# Patient Record
Sex: Female | Born: 1975 | Race: White | Hispanic: No | Smoking: Current every day smoker
Health system: Southern US, Community
[De-identification: ages and names within clinical notes are randomized; demographics above are authoritative.]

## PROBLEM LIST (undated history)

## (undated) DIAGNOSIS — F319 Bipolar disorder, unspecified: Secondary | ICD-10-CM

## (undated) DIAGNOSIS — F4310 Post-traumatic stress disorder, unspecified: Secondary | ICD-10-CM

## (undated) DIAGNOSIS — B1920 Unspecified viral hepatitis C without hepatic coma: Secondary | ICD-10-CM

## (undated) DIAGNOSIS — O24419 Gestational diabetes mellitus in pregnancy, unspecified control: Secondary | ICD-10-CM

## (undated) HISTORY — PX: APPENDECTOMY: SHX54

---
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
## 2004-11-11 ENCOUNTER — Emergency Department: Payer: Self-pay | Admitting: Emergency Medicine

## 2005-07-22 ENCOUNTER — Emergency Department: Payer: Self-pay | Admitting: General Practice

## 2005-07-24 ENCOUNTER — Emergency Department: Payer: Self-pay

## 2005-10-01 ENCOUNTER — Emergency Department: Payer: Self-pay | Admitting: Emergency Medicine

## 2006-03-03 ENCOUNTER — Emergency Department: Payer: Self-pay | Admitting: Emergency Medicine

## 2006-09-04 ENCOUNTER — Emergency Department: Payer: Self-pay | Admitting: Emergency Medicine

## 2006-10-02 ENCOUNTER — Emergency Department: Payer: Self-pay | Admitting: Emergency Medicine

## 2007-04-30 DIAGNOSIS — Z915 Personal history of self-harm: Secondary | ICD-10-CM | POA: Insufficient documentation

## 2007-04-30 DIAGNOSIS — F1910 Other psychoactive substance abuse, uncomplicated: Secondary | ICD-10-CM | POA: Insufficient documentation

## 2007-06-08 ENCOUNTER — Ambulatory Visit: Payer: Self-pay

## 2008-04-03 ENCOUNTER — Emergency Department: Payer: Self-pay | Admitting: Emergency Medicine

## 2008-05-15 ENCOUNTER — Emergency Department: Payer: Self-pay | Admitting: Emergency Medicine

## 2008-08-01 ENCOUNTER — Emergency Department: Payer: Self-pay | Admitting: Emergency Medicine

## 2010-11-20 ENCOUNTER — Emergency Department: Payer: Self-pay | Admitting: Emergency Medicine

## 2010-12-07 ENCOUNTER — Emergency Department: Payer: Self-pay | Admitting: Internal Medicine

## 2011-04-19 ENCOUNTER — Emergency Department: Payer: Self-pay | Admitting: Emergency Medicine

## 2011-05-05 ENCOUNTER — Emergency Department: Payer: Self-pay | Admitting: *Deleted

## 2011-07-08 ENCOUNTER — Emergency Department: Payer: Self-pay | Admitting: Emergency Medicine

## 2013-01-15 ENCOUNTER — Observation Stay: Payer: Self-pay | Admitting: Surgery

## 2013-01-15 LAB — CBC WITH DIFFERENTIAL/PLATELET
BASOS PCT: 0.5 %
Basophil #: 0.1 10*3/uL (ref 0.0–0.1)
Eosinophil #: 0.1 10*3/uL (ref 0.0–0.7)
Eosinophil %: 1.1 %
HCT: 44.1 % (ref 35.0–47.0)
HGB: 15 g/dL (ref 12.0–16.0)
LYMPHS ABS: 1.8 10*3/uL (ref 1.0–3.6)
LYMPHS PCT: 12.9 %
MCH: 30.4 pg (ref 26.0–34.0)
MCHC: 34 g/dL (ref 32.0–36.0)
MCV: 90 fL (ref 80–100)
Monocyte #: 1.2 x10 3/mm — ABNORMAL HIGH (ref 0.2–0.9)
Monocyte %: 8.7 %
NEUTROS PCT: 76.8 %
Neutrophil #: 10.5 10*3/uL — ABNORMAL HIGH (ref 1.4–6.5)
PLATELETS: 271 10*3/uL (ref 150–440)
RBC: 4.92 10*6/uL (ref 3.80–5.20)
RDW: 13.1 % (ref 11.5–14.5)
WBC: 13.7 10*3/uL — ABNORMAL HIGH (ref 3.6–11.0)

## 2013-01-15 LAB — PREGNANCY, URINE: Pregnancy Test, Urine: NEGATIVE m[IU]/mL

## 2013-01-15 LAB — COMPREHENSIVE METABOLIC PANEL
ALBUMIN: 4.1 g/dL (ref 3.4–5.0)
ALT: 27 U/L (ref 12–78)
Alkaline Phosphatase: 81 U/L
Anion Gap: 20 — ABNORMAL HIGH (ref 7–16)
BUN: 6 mg/dL — AB (ref 7–18)
Bilirubin,Total: 0.7 mg/dL (ref 0.2–1.0)
CHLORIDE: 104 mmol/L (ref 98–107)
CO2: 21 mmol/L (ref 21–32)
Calcium, Total: 8.9 mg/dL (ref 8.5–10.1)
Creatinine: 0.64 mg/dL (ref 0.60–1.30)
EGFR (African American): >60
EGFR (Non-African Amer.): >60
GLUCOSE: 129 mg/dL — AB (ref 65–99)
Osmolality: 288 (ref 275–301)
POTASSIUM: 3.5 mmol/L (ref 3.5–5.1)
SGOT(AST): 23 U/L (ref 15–37)
SODIUM: 145 mmol/L (ref 136–145)
Total Protein: 8 g/dL (ref 6.4–8.2)

## 2013-01-15 LAB — URINALYSIS, COMPLETE
BILIRUBIN, UR: NEGATIVE
Blood: NEGATIVE
GLUCOSE, UR: NEGATIVE mg/dL (ref 0–75)
Leukocyte Esterase: NEGATIVE
Nitrite: NEGATIVE
Ph: 5 (ref 4.5–8.0)
Protein: 30
Specific Gravity: 1.025 (ref 1.003–1.030)
Squamous Epithelial: 1
WBC UR: 1 /HPF (ref 0–5)

## 2013-01-15 LAB — LIPASE, BLOOD: Lipase: 108 U/L (ref 73–393)

## 2013-01-20 LAB — PATHOLOGY REPORT

## 2013-02-28 ENCOUNTER — Emergency Department: Payer: Self-pay | Admitting: Emergency Medicine

## 2013-06-03 ENCOUNTER — Emergency Department: Payer: Self-pay | Admitting: Emergency Medicine

## 2014-04-29 NOTE — H&P (Signed)
PATIENT NAME:  Yolanda Reed, [name] MR#:  [id number] DATE OF BIRTH:  May 30, 1975  DATE OF ADMISSION:  01/15/2013  CHIEF COMPLAINT: Right lower quadrant pain.   HISTORY OF PRESENT ILLNESS: This is a patient with right lower quadrant pain and tenderness with signs of peritoneal irritation and a work-up showing probable appendicitis. The patient describes 18 hours of abdominal pain worsening over the last few hours. She has had nausea and vomiting. No diarrhea and has had normal bowel movement. She denies fevers or chills. Has never had an episode like this before.   PAST MEDICAL HISTORY: Depression, but she does not take any medications for it.   PAST SURGICAL HISTORY: C-section.   ALLERGIES: None.   MEDICATIONS: Vitamins.   FAMILY HISTORY: Noncontributory. Her mother just died of CHF.   SOCIAL HISTORY: The patient smokes tobacco. Does not drink alcohol and is currently unemployed.   REVIEW OF SYSTEMS: A 10 system review is performed and negative with the exception of that mentioned in the history of present illness.   PHYSICAL EXAMINATION: GENERAL: Healthy, comfortable-appearing female patient. She prefers this sit up rather than lie down.  VITAL SIGNS: Temperature of 98.2, pulse 84, respirations 18, blood pressure 138/75, 100% room air sat. Pain scale of 5.  HEENT: Shows no scleral icterus.  NECK: No palpable neck nodes.  CHEST: Clear to auscultation.  CARDIAC: Regular rate and rhythm.  ABDOMEN: Soft. There is tenderness in the right lower quadrant with some focal guarding. A positive Rovsing sign, positive percussion tenderness at McBurney's point.  EXTREMITIES: Without edema.  NEUROLOGIC: Grossly intact.  INTEGUMENT: Shows multiple tattoos.   LABORATORY DATA: White blood cell count is elevated at 13.7, hemoglobin and hematocrit of 15 and 44 and platelet count 271. Electrolytes are within normal limits.   The CT scan demonstrates acute appendicitis with stranding in the right hemipelvis.    ASSESSMENT AND PLAN: This is a patient with history, physical and CT findings suggestive of acute appendicitis. I have recommended laparoscopy.  The options of observation have been reviewed. The rationale for offering this surgery has been discussed and the risks of bleeding, infection, recurrence, negative laparoscopy and an open procedure were all reviewed. She understood and agreed to proceed.    ____________________________ Adah Salvageichard E. Excell Seltzerooper, MD rec:dp D: 01/15/2013 09:52:11 ET T: 01/15/2013 10:20:01 ET JOB#: [id number]  cc: Adah Salvageichard E. Excell Seltzerooper, MD, <Dictator> Lattie HawICHARD E COOPER MD ELECTRONICALLY SIGNED 01/19/2013 11:46

## 2014-04-29 NOTE — Op Note (Signed)
PATIENT NAME:  Yolanda Reed, Yolanda Reed MR#:  [id number] DATE OF BIRTH:  07/20/1975  DATE OF PROCEDURE:  Alessandra Grout01/10/2013  PREOPERATIVE DIAGNOSIS: Acute appendicitis.   POSTOPERATIVE DIAGNOSIS: Acute appendicitis.   PROCEDURE: Laparoscopic appendectomy.   SURGEON: Richard E. Excell Seltzerooper, M.D.   ANESTHESIA: General with endotracheal tube.   INDICATIONS: This is a patient with progressive right lower quadrant pain and tenderness with signs of peritoneal irritation. Preoperatively, we discussed rationale for surgery, the options of observation, risk of bleeding, infection, recurrence of symptoms, failure to resolve her symptoms, open procedure and negative laparoscopy. This was all reviewed for her. She understood and agreed to proceed.   FINDINGS: Acute appendicitis, nonruptured. Extensive adhesions in the right lower quadrant.   DESCRIPTION OF PROCEDURE: The patient was induced to general anesthesia, given IV antibiotics. VTE prophylaxis was in place. She was prepped and draped in a sterile fashion. Marcaine was infiltrated in skin and subcutaneous tissues around the periumbilical area. An incision was made. Veress needle was placed. Pneumoperitoneum was obtained and a 5 mm trocar port was placed. The abdominal cavity was explored and under direct vision, a 12 mm left lateral port was placed and a 5 mm suprapubic port was placed. The appendix was identified in the right lower quadrant. There were extensive adhesions of right colon to the anterior abdominal wall, but this did not involve the area of the terminal ileum and cecum where the appendix was identified easily. The base of the appendix was divided with an  Endo GIA standard load and then multiple firings of the vascular load were performed over the mesoappendix and the specimen was passed out through the lateral port site with the aid of an Endo Catch bag. The port site was(Dictation Anomaly) <<replaced>> . The area was checked for hemostasis. There were 2 arterial  bleeders on the staple line, which were handled with clips. No further bleeding was noted. The area was irrigated with copious amounts of normal saline. Hemostasis was adequate, the left lateral port site was closed under direct vision utilizing an Endo Close technique with 0 Vicryl simple sutures. Again, hemostasis found to be adequate, therefore, pneumoperitoneum was released. All ports were removed. 4-0 subcuticular Monocryl was used on all skin edges. Steri-Strips, Mastisol and sterile dressings were placed. The patient tolerated the procedure well. There were no complications. She was taken to the recovery room in stable condition to be admitted for continued care.    ____________________________ Adah Salvageichard E. Excell Seltzerooper, MD rec:dp D: 01/15/2013 12:40:00 ET T: 01/15/2013 16:38:32 ET JOB#: [id number]  cc: Adah Salvageichard E. Excell Seltzerooper, MD, <Dictator> Lattie HawICHARD E COOPER MD ELECTRONICALLY SIGNED 01/19/2013 11:47

## 2014-04-29 NOTE — H&P (Signed)
Subjective/Chief Complaint RLQ pain   History of Present Illness 18 hrs pain, n/v nml bm no f/c   Past History PMH none PSH C section   Past Medical Health Smoking   Past Med/Surgical Hx:  Depression:   c-section:   ALLERGIES:  No Known Allergies:   Family and Social History:  Family History Non-Contributory   Social History positive  tobacco, negative ETOH, unemployed   + Tobacco Current (within 1 year)   Review of Systems:  Fever/Chills No   Cough No   Abdominal Pain Yes   Diarrhea No   Constipation No   Nausea/Vomiting Yes   SOB/DOE No   Chest Pain No   Dysuria No   Tolerating Diet No  Nauseated  Vomiting   Medications/Allergies Reviewed Medications/Allergies reviewed   Physical Exam:  GEN no acute distress   HEENT pink conjunctivae   NECK supple   RESP normal resp effort  clear BS   CARD regular rate   ABD positive tenderness  soft  max rlq with pos rovsing's   LYMPH negative neck   SKIN normal to palpation   PSYCH alert, A+O to time, place, person   Lab Results: Hepatic:  10-Jan-15 00:54   Bilirubin, Total 0.7  Alkaline Phosphatase 81 (45-117 NOTE: New Reference Range 11/26/12)  SGPT (ALT) 27  SGOT (AST) 23  Total Protein, Serum 8.0  Albumin, Serum 4.1  Routine Chem:  10-Jan-15 00:54   Glucose, Serum  129  BUN  6  Creatinine (comp) 0.64  Sodium, Serum 145  Potassium, Serum 3.5  Chloride, Serum 104  CO2, Serum 21  Calcium (Total), Serum 8.9  Osmolality (calc) 288  eGFR (African American) >60  eGFR (Non-African American) >60 (eGFR values <57m/min/1.73 m2 may be an indication of chronic kidney disease (CKD). Calculated eGFR is useful in patients with stable renal function. The eGFR calculation will not be reliable in acutely ill patients when serum creatinine is changing rapidly. It is not useful in  patients on dialysis. The eGFR calculation may not be applicable to patients at the low and high extremes of body  sizes, pregnant women, and vegetarians.)  Anion Gap  20  Lipase 108 (Result(s) reported on 15 Jan 2013 at 01:25AM.)  Routine UA:  10-Jan-15 00:54   Color (UA) Yellow  Clarity (UA) Clear  Glucose (UA) Negative  Bilirubin (UA) Negative  Ketones (UA) 1+  Specific Gravity (UA) 1.025  Blood (UA) Negative  pH (UA) 5.0  Protein (UA) 30 mg/dL  Nitrite (UA) Negative  Leukocyte Esterase (UA) Negative (Result(s) reported on 15 Jan 2013 at 01:18AM.)  RBC (UA) 3 /HPF  WBC (UA) 1 /HPF  Bacteria (UA) TRACE  Epithelial Cells (UA) 1 /HPF  Mucous (UA) PRESENT (Result(s) reported on 15 Jan 2013 at 01:18AM.)  Routine Sero:  10-Jan-15 00:54   Pregnancy Test, Urine NEGATIVE (The results of the qualitative urine HCG (Pregnancy Test) should be evaluated in light of other clinical information.  There are limitations to the test which, in certain clinical situations, may result in a false positive or negative result. Thehigh dose hook effect can occur in urine samples with extremely high HCG concentrations.  This effect can produce a negative result in certain situations. It is suggested that results of the qualitative HCG be confirmed by an alternate methodology, such as the quantitative serum beta HCG test.)  Routine Hem:  10-Jan-15 00:54   WBC (CBC)  13.7  RBC (CBC) 4.92  Hemoglobin (CBC) 15.0  Hematocrit (CBC)  44.1  Platelet Count (CBC) 271  MCV 90  MCH 30.4  MCHC 34.0  RDW 13.1  Neutrophil % 76.8  Lymphocyte % 12.9  Monocyte % 8.7  Eosinophil % 1.1  Basophil % 0.5  Neutrophil #  10.5  Lymphocyte # 1.8  Monocyte #  1.2  Eosinophil # 0.1  Basophil # 0.1 (Result(s) reported on 15 Jan 2013 at 01:12AM.)   Radiology Results: CT:    10-Jan-15 08:10, CT Abdomen and Pelvis With Contrast  CT Abdomen and Pelvis With Contrast  REASON FOR EXAM:    (1) rlq abd pain with n/v; (2) see abd  COMMENTS:       PROCEDURE: CT  - CT ABDOMEN / PELVIS  W  - Jan 15 2013  8:10AM     CLINICAL DATA:   Sudden onset of abdominal pain radiating to the  right lower quadrant.    EXAM:  CT ABDOMENAND PELVIS WITH CONTRAST    TECHNIQUE:  Multidetector CT imaging of the abdomen and pelvis was performed  using the standard protocol following bolus administration of  intravenous contrast.  CONTRAST:  125 cc Isovue 370    COMPARISON:  None.    FINDINGS:  Lung bases are clear.  No pleural or pericardial fluid.    The liver has a normal appearance without focal lesions or biliary  ductal dilatation. No calcified gallstones. The spleen is normal.  The pancreas is normal. The adrenal glands are normal. The kidneys  are normal. No evidence of cyst, mass, stone or hydronephrosis.    The appendix is dilated and fluid-filled and there is mild  periappendiceal stranding consistent with early appendicitis.  The uterus and adnexal regions are normal. No other bowel pathology  is seen.     IMPRESSION:  Early appendicitis. The appendix is present in the right pelvis. No  evidence of rupture or abscess at this time.      Electronically Signed    By: Nelson Chimes M.D.    On: 01/15/2013 08:30         Verified By: Jules Schick, M.D.,    Assessment/Admission Diagnosis ac appendicitis lap appy risks and options agrees with plan   Electronic Signatures: Florene Glen (MD)  (Signed 10-Jan-15 09:49)  Authored: CHIEF COMPLAINT and HISTORY, PAST MEDICAL/SURGIAL HISTORY, ALLERGIES, FAMILY AND SOCIAL HISTORY, REVIEW OF SYSTEMS, PHYSICAL EXAM, LABS, Radiology, ASSESSMENT AND PLAN   Last Updated: 10-Jan-15 09:49 by Florene Glen (MD)

## 2015-09-20 DIAGNOSIS — F4310 Post-traumatic stress disorder, unspecified: Secondary | ICD-10-CM

## 2015-09-20 DIAGNOSIS — F319 Bipolar disorder, unspecified: Secondary | ICD-10-CM

## 2015-09-20 HISTORY — DX: Bipolar disorder, unspecified: F31.9

## 2015-09-20 HISTORY — DX: Post-traumatic stress disorder, unspecified: F43.10

## 2015-09-20 LAB — HM PAP SMEAR: HM Pap smear: NEGATIVE

## 2015-09-20 LAB — HM HIV SCREENING LAB: HM HIV Screening: NEGATIVE

## 2015-10-31 ENCOUNTER — Encounter: Payer: Self-pay | Admitting: Emergency Medicine

## 2015-10-31 ENCOUNTER — Emergency Department
Admission: EM | Admit: 2015-10-31 | Discharge: 2015-11-01 | Disposition: A | Discharge status: Home / Self Care | Payer: Self-pay | Attending: Emergency Medicine | Admitting: Emergency Medicine

## 2015-10-31 DIAGNOSIS — R1032 Left lower quadrant pain: Secondary | ICD-10-CM | POA: Insufficient documentation

## 2015-10-31 DIAGNOSIS — F17210 Nicotine dependence, cigarettes, uncomplicated: Secondary | ICD-10-CM | POA: Insufficient documentation

## 2015-10-31 LAB — URINALYSIS COMPLETE WITH MICROSCOPIC (ARMC ONLY)
BILIRUBIN URINE: NEGATIVE
Bacteria, UA: NONE SEEN
GLUCOSE, UA: NEGATIVE mg/dL
KETONES UR: NEGATIVE mg/dL
Leukocytes, UA: NEGATIVE
NITRITE: NEGATIVE
PROTEIN: NEGATIVE mg/dL
SPECIFIC GRAVITY, URINE: 1.026 (ref 1.005–1.030)
pH: 5 (ref 5.0–8.0)

## 2015-10-31 LAB — COMPREHENSIVE METABOLIC PANEL
ALBUMIN: 3.9 g/dL (ref 3.5–5.0)
ALK PHOS: 60 U/L (ref 38–126)
ALT: 37 U/L (ref 14–54)
ANION GAP: 7 (ref 5–15)
AST: 31 U/L (ref 15–41)
BILIRUBIN TOTAL: 0.5 mg/dL (ref 0.3–1.2)
BUN: 15 mg/dL (ref 6–20)
CALCIUM: 8.8 mg/dL — AB (ref 8.9–10.3)
CO2: 24 mmol/L (ref 22–32)
CREATININE: 0.64 mg/dL (ref 0.44–1.00)
Chloride: 109 mmol/L (ref 101–111)
GFR calc non Af Amer: >60 mL/min (ref >60)
GLUCOSE: 142 mg/dL — AB (ref 65–99)
Potassium: 3.7 mmol/L (ref 3.5–5.1)
Sodium: 140 mmol/L (ref 135–145)
TOTAL PROTEIN: 7.1 g/dL (ref 6.5–8.1)

## 2015-10-31 LAB — CBC
HCT: 38.5 % (ref 35.0–47.0)
HEMOGLOBIN: 12.9 g/dL (ref 12.0–16.0)
MCH: 29.7 pg (ref 26.0–34.0)
MCHC: 33.5 g/dL (ref 32.0–36.0)
MCV: 88.7 fL (ref 80.0–100.0)
PLATELETS: 327 10*3/uL (ref 150–440)
RBC: 4.35 MIL/uL (ref 3.80–5.20)
RDW: 15.5 % — ABNORMAL HIGH (ref 11.5–14.5)
WBC: 10 10*3/uL (ref 3.6–11.0)

## 2015-10-31 LAB — POCT PREGNANCY, URINE: Preg Test, Ur: NEGATIVE

## 2015-10-31 LAB — LIPASE, BLOOD: Lipase: 27 U/L (ref 11–51)

## 2015-10-31 NOTE — ED Notes (Signed)
Pt reports pain to LLQ, but when pointing to painful area, points to L of umbilicus area.  Pt reports that the pain comes and goes and "travels" around her L side.  Pt also reports that she is due for her period in the next few days, but that this is "much worse than period cramps".

## 2015-10-31 NOTE — ED Provider Notes (Signed)
[facility] Emergency Department Provider Note   ____________________________________________   First MD Initiated Contact with Patient 10/31/15 2321     (approximate)  I have reviewed the triage vital signs and the nursing notes.   HISTORY  Chief Complaint Abdominal Pain    HPI Yolanda Reed is a 40 y.o. female who presents to the ED from home with a chief complaint of abdominal pain. Patient reports onset of left lower quadrant abdominal pain approximately 3-4 hours ago. Symptoms associated with nausea only. Ate chicken sandwich about 1 hour prior to onset of pain. Denies radiation to or from flank. Denies associatedfever, chills, chest pain, shortness of breath, vomiting, dysuria, hematuria, diarrhea. Last bowel movement earlier in the day which was normal for patient. Also denies vaginal discharge or bleeding. Reports she is due for her period in the next few days. Denies recent travel trauma. Currently patient is pain-free. No history of kidney stones, ovarian cysts or endometriosis.   Past medical history None  There are no active problems to display for this patient.   Past Surgical History:  Procedure Laterality Date  . APPENDECTOMY      Prior to Admission medications   Not on File    Allergies Review of patient's allergies indicates no known allergies.  No family history on file.  Social History Social History  Substance Use Topics  . Smoking status: Current Every Day Smoker    Packs/day: 1.00    Types: Cigarettes  . Smokeless tobacco: Never Used  . Alcohol use Yes    Review of Systems  Constitutional: No fever/chills. Eyes: No visual changes. ENT: No sore throat. Cardiovascular: Denies chest pain. Respiratory: Denies shortness of breath. Gastrointestinal: Positive for abdominal pain.  Positive for nausea, no vomiting.  No diarrhea.  No constipation. Genitourinary: Negative for dysuria. Musculoskeletal: Negative for back  pain. Skin: Negative for rash. Neurological: Negative for headaches, focal weakness or numbness.  10-point ROS otherwise negative.  ____________________________________________   PHYSICAL EXAM:  VITAL SIGNS: ED Triage Vitals [10/31/15 2154]  Enc Vitals Group     BP 126/84     Pulse Rate 89     Resp 18     Temp 99.2 F (37.3 C)     Temp Source Oral     SpO2 100 %     Weight 148 lb (67.1 kg)     Height 5\' 3"  (1.6 m)     Head Circumference      Peak Flow      Pain Score 6     Pain Loc      Pain Edu?      Excl. in GC?     Constitutional: Alert and oriented. Well appearing and in no acute distress. Eyes: Conjunctivae are normal. PERRL. EOMI. Head: Atraumatic. Nose: No congestion/rhinnorhea. Mouth/Throat: Mucous membranes are moist.  Oropharynx non-erythematous. Neck: No stridor.   Cardiovascular: Normal rate, regular rhythm. Grossly normal heart sounds.  Good peripheral circulation. Respiratory: Normal respiratory effort.  No retractions. Lungs CTAB. Gastrointestinal: Soft and nontender to both light and deep palpation. No distention. No abdominal bruits. No CVA tenderness. Musculoskeletal: No lower extremity tenderness nor edema.  No joint effusions. Neurologic:  Normal speech and language. No gross focal neurologic deficits are appreciated. No gait instability. Skin:  Skin is warm, dry and intact. No rash noted. Psychiatric: Mood and affect are normal. Speech and behavior are normal.  ____________________________________________   LABS (all labs ordered are listed, but only abnormal results are  displayed)  Labs Reviewed  COMPREHENSIVE METABOLIC PANEL - Abnormal; Notable for the following:       Result Value   Glucose, Bld 142 (*)    Calcium 8.8 (*)    All other components within normal limits  CBC - Abnormal; Notable for the following:    RDW 15.5 (*)    All other components within normal limits  URINALYSIS COMPLETEWITH MICROSCOPIC ([facility] ONLY) - Abnormal;  Notable for the following:    Color, Urine YELLOW (*)    APPearance CLEAR (*)    Hgb urine dipstick 1+ (*)    Squamous Epithelial / LPF 0-5 (*)    All other components within normal limits  LIPASE, BLOOD  POCT PREGNANCY, URINE   ____________________________________________  EKG  None ____________________________________________  RADIOLOGY  None ____________________________________________   PROCEDURES  Procedure(s) performed: None  Procedures  Critical Care performed: No  ____________________________________________   INITIAL IMPRESSION / ASSESSMENT AND PLAN / ED COURSE  Pertinent labs & imaging results that were available during my care of the patient were reviewed by me and considered in my medical decision making (see chart for details).  40 year old female who presents with left lower quadrant abdominal pain and nausea; both have subsided. Patient is eager for discharge as she starts a new job in the morning. Updated patient and spouse of laboratory and urinalysis results. Given that patient is pain-free and she has no abdominal tenderness to palpation, will hold imaging study and discharge per patient's request. Strict return precautions given. Both verbalize understanding and agree with plan of care.  Clinical Course     ____________________________________________   FINAL CLINICAL IMPRESSION(S) / ED DIAGNOSES  Final diagnoses:  Left lower quadrant pain      NEW MEDICATIONS STARTED DURING THIS VISIT:  New Prescriptions   No medications on file     Note:  This document was prepared using Dragon voice recognition software and may include unintentional dictation errors.    Irean HongJade J Sung, MD 11/01/15 [id number]

## 2015-10-31 NOTE — ED Triage Notes (Signed)
Pt ambulatory to triage with steady gait with c/o LLQ pain tonight accompanied by nausea. Pt denies diarrhea, vomiting, chest pain, or urinary symptoms. Pt alert and oriented x 4, no increased work in breathing.

## 2015-10-31 NOTE — Discharge Instructions (Signed)
Please return to the ER for recurrent or worsening symptoms, persistent vomiting, difficulty breathing or other concerns.

## 2015-10-31 NOTE — ED Notes (Signed)
Pt unable to provide urine specimen at this time. Pt provided with urine cup and informed to let staff know when able to provide specimen.

## 2015-11-01 NOTE — ED Notes (Signed)
Pt discharged to home.  Family member driving.  Discharge instructions reviewed.  Verbalized understanding.  No questions or concerns at this time.  Teach back verified.  Pt in NAD.  No items left in ED.   

## 2015-12-23 ENCOUNTER — Encounter: Payer: Self-pay | Admitting: Urgent Care

## 2015-12-23 ENCOUNTER — Emergency Department
Admission: EM | Admit: 2015-12-23 | Discharge: 2015-12-23 | Disposition: A | Discharge status: Home / Self Care | Payer: Self-pay | Attending: Emergency Medicine | Admitting: Emergency Medicine

## 2015-12-23 DIAGNOSIS — L03316 Cellulitis of umbilicus: Secondary | ICD-10-CM | POA: Insufficient documentation

## 2015-12-23 DIAGNOSIS — F17210 Nicotine dependence, cigarettes, uncomplicated: Secondary | ICD-10-CM | POA: Insufficient documentation

## 2015-12-23 MED ORDER — CLINDAMYCIN HCL 300 MG PO CAPS: 300.0000 mg | ORAL_CAPSULE | Freq: Three times a day (TID) | ORAL | 0 refills | Status: AC

## 2015-12-23 MED ORDER — MUPIROCIN 2 % EX OINT: 1.0000 "application " | TOPICAL_OINTMENT | Freq: Two times a day (BID) | CUTANEOUS | 0 refills | Status: DC

## 2015-12-23 NOTE — ED Provider Notes (Signed)
[facility] Emergency Department Provider Note  ____________________________________________   I have reviewed the triage vital signs and the nursing notes.   HISTORY  Chief Complaint Umbilical hernia    HPI Yolanda Reed is a 40 y.o. female with a history of skin infections in the past, who is incarcerated at one time, no known MRSA infections, has what appears to be infection on her belly button. She noticed this morning. There was some purulent discharge. It is red and inflamed. She denies any abdominal pain vomiting a change in her stooling. She denies any injury. She has no fever or other complaints.   History reviewed. No pertinent past medical history.  There are no active problems to display for this patient.   Past Surgical History:  Procedure Laterality Date  . APPENDECTOMY    . CESAREAN SECTION      Prior to Admission medications   Not on File    Allergies Morphine and related  No family history on file.  Social History Social History  Substance Use Topics  . Smoking status: Current Every Day Smoker    Packs/day: 1.00    Types: Cigarettes  . Smokeless tobacco: Never Used  . Alcohol use Yes    Review of Systems Constitutional: No fever/chills Eyes: No visual changes. ENT: No sore throat. No stiff neck no neck pain Cardiovascular: Denies chest pain. Respiratory: Denies shortness of breath. Gastrointestinal:   no vomiting.  No diarrhea.  No constipation. Genitourinary: Negative for dysuria. Musculoskeletal: Negative lower extremity swelling Skin: Negative for rash. Neurological: Negative for severe headaches, focal weakness or numbness. 10-point ROS otherwise negative.  ____________________________________________   PHYSICAL EXAM:  VITAL SIGNS: ED Triage Vitals  Enc Vitals Group     BP 12/23/15 0634 122/86     Pulse Rate 12/23/15 0634 86     Resp 12/23/15 0634 16     Temp 12/23/15 0634 98.6 F (37 C)     Temp  Source 12/23/15 0634 Oral     SpO2 12/23/15 0634 99 %     Weight 12/23/15 0635 145 lb (65.8 kg)     Height 12/23/15 0635 5' 3.5" (1.613 m)     Head Circumference --      Peak Flow --      Pain Score 12/23/15 0635 5     Pain Loc --      Pain Edu? --      Excl. in GC? --     Constitutional: Alert and oriented. Well appearing and in no acute distress. Cardiovascular: Normal rate, regular rhythm. Grossly normal heart sounds.  Good peripheral circulation. Respiratory: Normal respiratory effort.  No retractions. Lungs CTAB. Abdominal: Soft and nontender. No distention. No guarding no rebound  Skin:  Skin is warm, dry and intact.There is some inflammation and excoriation to the umbilicus, on the inner surface. There is no active drainage at this time. It is mildly tender. No surrounding cellulitis. Psychiatric: Mood and affect are normal. Speech and behavior are normal.  ____________________________________________   LABS (all labs ordered are listed, but only abnormal results are displayed)  Labs Reviewed - No data to display ____________________________________________  EKG  I personally interpreted any EKGs ordered by me or triage  ____________________________________________  RADIOLOGY  I reviewed any imaging ordered by me or triage that were performed during my shift and, if possible, patient and/or family made aware of any abnormal findings. ____________________________________________   PROCEDURES  Procedure(s) performed: None  Procedures  Critical Care performed:  None  ____________________________________________   INITIAL IMPRESSION / ASSESSMENT AND PLAN / ED COURSE  Pertinent labs & imaging results that were available during my care of the patient were reviewed by me and considered in my medical decision making (see chart for details).  Patient with what appears to be an early cellulitic process in her belly button. We will treat with clindamycin. At this  time is nothing to culture. Return precautions and follow-up given and understood. Patient has no history of drainage from the umbilicus, nothing to suggest persistent urachal cyst or other deeper pathology. However, she understands she must come back if she gets worse. I will try clindamycin for MRSA coverage.  Clinical Course    ____________________________________________   FINAL CLINICAL IMPRESSION(S) / ED DIAGNOSES  Final diagnoses:  None      This chart was dictated using voice recognition software.  Despite best efforts to proofread,  errors can occur which can change meaning.      Jeanmarie PlantJames A Dariyon Urquilla, MD 12/23/15 [phone]

## 2015-12-23 NOTE — ED Triage Notes (Signed)
Patient presents with "slimy" discharge coming from umbilicus. Patient unable to report on timeframe of when symptoms started. Patient denies fever. 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 I think I have a hernia". Patient unable to advise on character of exudate; "maybe it is clear...maybe it is yellow.... Hell I dont know".

## 2016-04-28 ENCOUNTER — Emergency Department: Payer: Self-pay

## 2016-04-28 ENCOUNTER — Encounter: Payer: Self-pay | Admitting: Emergency Medicine

## 2016-04-28 ENCOUNTER — Emergency Department
Admission: EM | Admit: 2016-04-28 | Discharge: 2016-04-28 | Disposition: A | Discharge status: Home / Self Care | Payer: Self-pay | Attending: Emergency Medicine | Admitting: Emergency Medicine

## 2016-04-28 DIAGNOSIS — R102 Pelvic and perineal pain: Secondary | ICD-10-CM | POA: Insufficient documentation

## 2016-04-28 DIAGNOSIS — F1290 Cannabis use, unspecified, uncomplicated: Secondary | ICD-10-CM | POA: Insufficient documentation

## 2016-04-28 DIAGNOSIS — Y939 Activity, unspecified: Secondary | ICD-10-CM | POA: Insufficient documentation

## 2016-04-28 DIAGNOSIS — S2243XA Multiple fractures of ribs, bilateral, initial encounter for closed fracture: Secondary | ICD-10-CM | POA: Insufficient documentation

## 2016-04-28 DIAGNOSIS — F17210 Nicotine dependence, cigarettes, uncomplicated: Secondary | ICD-10-CM | POA: Insufficient documentation

## 2016-04-28 DIAGNOSIS — Y929 Unspecified place or not applicable: Secondary | ICD-10-CM | POA: Insufficient documentation

## 2016-04-28 DIAGNOSIS — R0781 Pleurodynia: Secondary | ICD-10-CM

## 2016-04-28 DIAGNOSIS — S51852A Open bite of left forearm, initial encounter: Secondary | ICD-10-CM | POA: Insufficient documentation

## 2016-04-28 DIAGNOSIS — W503XXA Accidental bite by another person, initial encounter: Secondary | ICD-10-CM

## 2016-04-28 DIAGNOSIS — S022XXA Fracture of nasal bones, initial encounter for closed fracture: Secondary | ICD-10-CM | POA: Insufficient documentation

## 2016-04-28 DIAGNOSIS — S5011XA Contusion of right forearm, initial encounter: Secondary | ICD-10-CM | POA: Insufficient documentation

## 2016-04-28 DIAGNOSIS — Y999 Unspecified external cause status: Secondary | ICD-10-CM | POA: Insufficient documentation

## 2016-04-28 LAB — CBC WITH DIFFERENTIAL/PLATELET
BASOS ABS: 0 10*3/uL (ref 0–0.1)
BASOS PCT: 0 %
Eosinophils Absolute: 0.1 10*3/uL (ref 0–0.7)
Eosinophils Relative: 1 %
HEMATOCRIT: 40.1 % (ref 35.0–47.0)
Hemoglobin: 13.1 g/dL (ref 12.0–16.0)
Lymphocytes Relative: 13 %
Lymphs Abs: 1.5 10*3/uL (ref 1.0–3.6)
MCH: 29.5 pg (ref 26.0–34.0)
MCHC: 32.7 g/dL (ref 32.0–36.0)
MCV: 90.1 fL (ref 80.0–100.0)
MONO ABS: 0.8 10*3/uL (ref 0.2–0.9)
MONOS PCT: 7 %
NEUTROS ABS: 9.6 10*3/uL — AB (ref 1.4–6.5)
Neutrophils Relative %: 79 %
PLATELETS: 344 10*3/uL (ref 150–440)
RBC: 4.46 MIL/uL (ref 3.80–5.20)
RDW: 13.7 % (ref 11.5–14.5)
WBC: 12.1 10*3/uL — ABNORMAL HIGH (ref 3.6–11.0)

## 2016-04-28 LAB — URINALYSIS, COMPLETE (UACMP) WITH MICROSCOPIC
Bacteria, UA: NONE SEEN
Bilirubin Urine: NEGATIVE
Glucose, UA: NEGATIVE mg/dL
Hgb urine dipstick: NEGATIVE
KETONES UR: NEGATIVE mg/dL
LEUKOCYTES UA: NEGATIVE
Nitrite: NEGATIVE
PH: 5 (ref 5.0–8.0)
Protein, ur: 30 mg/dL — AB
SPECIFIC GRAVITY, URINE: 1.018 (ref 1.005–1.030)

## 2016-04-28 LAB — COMPREHENSIVE METABOLIC PANEL
ALBUMIN: 3.9 g/dL (ref 3.5–5.0)
ALT: 33 U/L (ref 14–54)
ANION GAP: 9 (ref 5–15)
AST: 36 U/L (ref 15–41)
Alkaline Phosphatase: 82 U/L (ref 38–126)
BILIRUBIN TOTAL: 0.6 mg/dL (ref 0.3–1.2)
BUN: 11 mg/dL (ref 6–20)
CHLORIDE: 105 mmol/L (ref 101–111)
CO2: 26 mmol/L (ref 22–32)
Calcium: 9.1 mg/dL (ref 8.9–10.3)
Creatinine, Ser: 0.89 mg/dL (ref 0.44–1.00)
GFR calc Af Amer: >60 mL/min (ref >60)
GLUCOSE: 190 mg/dL — AB (ref 65–99)
POTASSIUM: 3.6 mmol/L (ref 3.5–5.1)
Sodium: 140 mmol/L (ref 135–145)
TOTAL PROTEIN: 7.2 g/dL (ref 6.5–8.1)

## 2016-04-28 LAB — HCG, QUANTITATIVE, PREGNANCY: hCG, Beta Chain, Quant, S: 1 m[IU]/mL (ref <5)

## 2016-04-28 LAB — POCT PREGNANCY, URINE: PREG TEST UR: NEGATIVE

## 2016-04-28 MED ORDER — NAPROXEN 500 MG PO TABS: 500.0000 mg | ORAL_TABLET | Freq: Two times a day (BID) | ORAL | 0 refills | Status: DC

## 2016-04-28 MED ORDER — AMOXICILLIN-POT CLAVULANATE 875-125 MG PO TABS: 1.0000 | ORAL_TABLET | Freq: Two times a day (BID) | ORAL | 0 refills | Status: AC

## 2016-04-28 MED ORDER — OXYCODONE-ACETAMINOPHEN 5-325 MG PO TABS
ORAL_TABLET | ORAL | Status: AC
  Administered 2016-04-28: 1 via ORAL
  Filled 2016-04-28: qty 1

## 2016-04-28 MED ORDER — ONDANSETRON HCL 4 MG/2ML IJ SOLN
4.0000 mg | Freq: Once | INTRAMUSCULAR | Status: AC
  Administered 2016-04-28: 4 mg via INTRAVENOUS
  Filled 2016-04-28: qty 2

## 2016-04-28 MED ORDER — FENTANYL CITRATE (PF) 100 MCG/2ML IJ SOLN
100.0000 ug | Freq: Once | INTRAMUSCULAR | Status: AC
  Administered 2016-04-28: 100 ug via INTRAVENOUS
  Filled 2016-04-28: qty 2

## 2016-04-28 MED ORDER — OXYCODONE-ACETAMINOPHEN 5-325 MG PO TABS: 1.0000 | ORAL_TABLET | ORAL | 0 refills | Status: DC | PRN

## 2016-04-28 MED ORDER — OXYCODONE-ACETAMINOPHEN 5-325 MG PO TABS
1.0000 | ORAL_TABLET | Freq: Once | ORAL | Status: AC
  Administered 2016-04-28: 1 via ORAL

## 2016-04-28 MED ORDER — AMOXICILLIN-POT CLAVULANATE 875-125 MG PO TABS
ORAL_TABLET | ORAL | Status: AC
  Administered 2016-04-28: 1 via ORAL
  Filled 2016-04-28: qty 1

## 2016-04-28 MED ORDER — AMOXICILLIN-POT CLAVULANATE 875-125 MG PO TABS
1.0000 | ORAL_TABLET | Freq: Once | ORAL | Status: AC
  Administered 2016-04-28: 1 via ORAL
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
## 2016-04-28 NOTE — ED Notes (Signed)
CT tech notified of pt's negative pregnancy result;

## 2016-04-28 NOTE — ED Notes (Addendum)
Patient has already been transported to CT and xray via stretcher; pt has one visitor in room, waiting for her return
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
## 2016-04-28 NOTE — ED Notes (Signed)
Patient transported to X-ray via stretcher 

## 2016-04-28 NOTE — ED Notes (Signed)
CT cervical spine negative; MD aware; collar removed per MD and xray called for pt to have the rest of her tests performed;
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
## 2016-04-28 NOTE — ED Notes (Signed)
MD available and informed of pt's c/o increased pain; no new order given at this time

## 2016-04-28 NOTE — ED Notes (Signed)
Pt returned from xray, resting in bed, family at bedside 

## 2017-02-27 ENCOUNTER — Other Ambulatory Visit: Payer: Self-pay

## 2017-02-27 ENCOUNTER — Encounter: Payer: Self-pay | Admitting: Emergency Medicine

## 2017-02-27 ENCOUNTER — Emergency Department
Admission: EM | Admit: 2017-02-27 | Discharge: 2017-02-27 | Disposition: A | Discharge status: Home / Self Care | Payer: Self-pay | Attending: Emergency Medicine | Admitting: Emergency Medicine

## 2017-02-27 DIAGNOSIS — K029 Dental caries, unspecified: Secondary | ICD-10-CM | POA: Insufficient documentation

## 2017-02-27 DIAGNOSIS — F17210 Nicotine dependence, cigarettes, uncomplicated: Secondary | ICD-10-CM | POA: Insufficient documentation

## 2017-02-27 MED ORDER — NAPROXEN 500 MG PO TABS: 500.0000 mg | ORAL_TABLET | Freq: Two times a day (BID) | ORAL | 0 refills | Status: DC

## 2017-02-27 MED ORDER — AMOXICILLIN 500 MG PO CAPS
500.0000 mg | ORAL_CAPSULE | Freq: Once | ORAL | Status: AC
  Administered 2017-02-27: 500 mg via ORAL
  Filled 2017-02-27: qty 1

## 2017-02-27 MED ORDER — ACETAMINOPHEN 500 MG PO TABS
ORAL_TABLET | ORAL | Status: AC
  Filled 2017-02-27: qty 2

## 2017-02-27 MED ORDER — AMOXICILLIN 500 MG PO CAPS: 500.0000 mg | ORAL_CAPSULE | Freq: Three times a day (TID) | ORAL | 0 refills | Status: DC

## 2017-02-27 MED ORDER — ACETAMINOPHEN 500 MG PO TABS
1000.0000 mg | ORAL_TABLET | Freq: Once | ORAL | Status: AC
  Administered 2017-02-27: 1000 mg via ORAL

## 2017-02-27 NOTE — Discharge Instructions (Signed)
In taking amoxicillin 500 mg 3 times a day for the next 10 days.  Use your good Rx card for this prescription. Do not smoke. A list of dental clinics are provided free to contact and be seen.  Also [facility] takes walk-ins.  OPTIONS FOR DENTAL FOLLOW UP CARE  [place] Department of Health and Human Services - Local Safety Net Dental Clinics [email or web address]://www.ncdhhs.gov/dph/oralhealth/services/safetynetclinics.htm   [facility] [phone])  [facility] [phone])  [facility] [phone]  [facility] [phone])  [facility] [phone]) This clinic caters to the indigent population and is on a lottery system. Location: [employer] [employer] [address], [address], [place] Clinic Hours: Wednesdays from 6pm - 9pm, patients seen by a lottery system. For dates, call or go to [email or web address] Services: Cleanings, fillings and simple extractions. Payment Options: DENTAL WORK IS FREE OF CHARGE. Bring proof of income or support. Best way to get seen: Arrive at 5:15 pm - this is a lottery, NOT first come/first serve, so arriving earlier will not increase your chances of being seen.     [facility] [phone] Select option 1 for emergencies   Location: [facility], [address], [address], [place] [facility] Hours: No walk-ins accepted - call the day before to schedule an appointment. Check in times are 9:30 am and 1:30 pm. Services: Simple extractions, temporary fillings, pulpectomy/pulp debridement, uncomplicated abscess drainage. Payment Options: PAYMENT IS DUE AT THE TIME OF SERVICE.  Fee is usually $100-200, additional surgical procedures (e.g. abscess drainage) may be extra. Cash, checks, Visa/MasterCard accepted.  Can file Medicaid if patient is covered for dental - patient should call case  worker to check. No discount for [facility] patients. Best way to get seen: MUST call the day before and get onto the schedule. Can usually be seen the next 1-2 days. No walk-ins accepted.     [facility] [phone]   Location: [facility], [address], [place] Clinic Hours: M, W, Th, F 8am or 1:30pm, Tues 9a or 1:30 - first come/first served. Services: Simple extractions, temporary fillings, uncomplicated abscess drainage.  You do not need to be an [place] resident. Payment Options: PAYMENT IS DUE AT THE TIME OF SERVICE. Dental insurance, otherwise sliding scale - bring proof of income or support. Depending on income and treatment needed, cost is usually $50-200. Best way to get seen: Arrive early as it is first come/first served.     [facility] [phone]   Location: [address] Clinic Hours: Mon-Thu 8a-5p Services: Most basic dental services including extractions and fillings. Payment Options: PAYMENT IS DUE AT THE TIME OF SERVICE. Sliding scale, up to 50% off - bring proof if income or support. Medicaid with dental option accepted. Best way to get seen: Call to schedule an appointment, can usually be seen within 2 weeks OR they will try to see walk-ins - show up at 8a or 2p (you may have to wait).     [facility] [phone] [place] RESIDENTS ONLY   Location: [facility], [address], [place], [place] [id number] Clinic Hours: By appointment only. Monday - Thursday 8am-5pm, Friday 8am-12pm Services: Cleanings, fillings, extractions. Payment Options: PAYMENT IS DUE AT THE TIME OF SERVICE. Cash, Visa or MasterCard. Sliding scale - $30 minimum per service. Best way to get seen: Come in  to office, complete packet and make an appointment - need proof of income or support monies for each household member and proof of  [facility] residence. Usually takes about a month to get in.     [facility] [phone]   Location: [address] [facility] Hours: Walk-in Urgent Care Dental Services are offered Monday-Friday mornings only. The numbers of emergencies accepted daily is limited to the number of providers available. Maximum 15 - Mondays, Wednesdays & Thursdays Maximum 10 - Tuesdays & Fridays Services: You do not need to be a [facility] resident to be seen for a dental emergency. Emergencies are defined as pain, swelling, abnormal bleeding, or dental trauma. Walkins will receive x-rays if needed. NOTE: Dental cleaning is not an emergency. Payment Options: PAYMENT IS DUE AT THE TIME OF SERVICE. Minimum co-pay is $40.00 for uninsured patients. Minimum co-pay is $3.00 for Medicaid with dental coverage. Dental Insurance is accepted and must be presented at time of visit. Medicare does not cover dental. Forms of payment: Cash, credit card, checks. Best way to get seen: If not previously registered with the clinic, walk-in dental registration begins at 7:15 am and is on a first come/first serve basis. If previously registered with the clinic, call to make an appointment.     [facility] [phone] [place] RESIDENTS ONLY   Location: [address], [place], [place] Clinic Hours: Mon-Thu 10a-2p Services: Extractions only! Payment Options: FREE (donations accepted) - bring proof of income or support Best way to get seen: Call and schedule an appointment OR come at 8am on the 1st Monday of every month (except for holidays) when it is first come/first served.     [facility] [phone]   Location: [address] [place], [place] Clinic Hours: Friday mornings Services, Payment Options, Best way to get seen: Call for info
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
## 2017-02-27 NOTE — ED Triage Notes (Signed)
C/O right lower jaw pain and swelling.

## 2017-02-27 NOTE — ED Provider Notes (Signed)
[facility] Emergency Department Provider Note  ____________________________________________   First MD Initiated Contact with Patient 02/27/17 1333     (approximate)  I have reviewed the triage vital signs and the nursing notes.   HISTORY  Chief Complaint Dental Pain   HPI Yolanda Reed is a 42 y.o. female is here with complaint of right lower jaw pain due to a dental cavity.  She also complains of swelling.  Patient denies any fever or chills.  She states that she broke her tooth eating taffy 2 days ago.  She has not contacted a dentist to be seen.  Patient states she has decreased the amount of smoking since her tooth began hurting.  She rates her pain is not over 10.  History reviewed. No pertinent past medical history.  There are no active problems to display for this patient.   Past Surgical History:  Procedure Laterality Date  . APPENDECTOMY    . CESAREAN SECTION      Prior to Admission medications   Medication Sig Start Date End Date Taking? Authorizing Provider  amoxicillin (AMOXIL) 500 MG capsule Take 1 capsule (500 mg total) by mouth 3 (three) times daily. 02/27/17   Tommi Rumps, PA-C  naproxen (NAPROSYN) 500 MG tablet Take 1 tablet (500 mg total) by mouth 2 (two) times daily with a meal. 02/27/17   Tommi Rumps, PA-C    Allergies Morphine and related  No family history on file.  Social History Social History   Tobacco Use  . Smoking status: Current Every Day Smoker    Packs/day: 0.50    Types: Cigarettes  . Smokeless tobacco: Never Used  Substance Use Topics  . Alcohol use: No  . Drug use: Yes    Types: Marijuana    Comment: unsure the last time she smoked    Review of Systems Constitutional: No fever/chills ENT: Positive for dental pain. Cardiovascular: Denies chest pain. Respiratory: Denies shortness of breath. Gastrointestinal:   No nausea, no vomiting. Neurological: Negative for headaches, focal weakness  or numbness. ___________________________________________   PHYSICAL EXAM:  VITAL SIGNS: ED Triage Vitals  Enc Vitals Group     BP 02/27/17 1312 (!) 138/91     Pulse Rate 02/27/17 1312 70     Resp 02/27/17 1312 16     Temp 02/27/17 1312 99.6 F (37.6 C)     Temp Source 02/27/17 1312 Oral     SpO2 02/27/17 1312 98 %     Weight 02/27/17 1249 150 lb (68 kg)     Height 02/27/17 1249 5\' 4"  (1.626 m)     Head Circumference --      Peak Flow --      Pain Score 02/27/17 1248 9     Pain Loc --      Pain Edu? --      Excl. in GC? --    Constitutional: Alert and oriented. Well appearing and in no acute distress. Eyes: Conjunctivae are normal.  Head: Atraumatic. Nose: No congestion/rhinnorhea. Mouth/Throat: Mucous membranes are moist.  Oropharynx non-erythematous.  Right lower premolar molar appears to have a old cavity without repair.  Area is tender.  There is no obvious abscess or drainage.  Gums are tender. Neck: No stridor.   Hematological/Lymphatic/Immunilogical: No cervical lymphadenopathy. Cardiovascular: Normal rate, regular rhythm. Grossly normal heart sounds.  Good peripheral circulation. Respiratory: Normal respiratory effort.  No retractions. Lungs CTAB. Gastrointestinal: Soft and nontender. No distention.  Neurologic:  Normal speech and  language. No gross focal neurologic deficits are appreciated.  Skin:  Skin is warm, dry and intact. No rash noted. Psychiatric: Mood and affect are normal. Speech and behavior are normal.  ____________________________________________   LABS (all labs ordered are listed, but only abnormal results are displayed)  Labs Reviewed - No data to display   PROCEDURES  Procedure(s) performed: None  Procedures  Critical Care performed: No  ____________________________________________   INITIAL IMPRESSION / ASSESSMENT AND PLAN / ED COURSE  As part of my medical decision making, I reviewed the following data within the electronic  MEDICAL RECORD NUMBER Notes from prior ED visits and  Controlled Substance Database  Patient was given prescription for amoxicillin 500 mg 3 times daily for 10 days and naproxen 500 mg twice daily with food.  Patient was given a list of dental clinics that she is qualified to see including [facility]. ____________________________________________   FINAL CLINICAL IMPRESSION(S) / ED DIAGNOSES  Final diagnoses:  Pain due to dental caries     ED Discharge Orders        Ordered    amoxicillin (AMOXIL) 500 MG capsule  3 times daily     02/27/17 1346    naproxen (NAPROSYN) 500 MG tablet  2 times daily with meals     02/27/17 1346       Note:  This document was prepared using Dragon voice recognition software and may include unintentional dictation errors.    Tommi RumpsSummers, Kelyn Koskela L, PA-C 02/27/17 1510    Sharyn CreamerQuale, Mark, MD 02/27/17 1626

## 2017-07-01 ENCOUNTER — Emergency Department
Admission: EM | Admit: 2017-07-01 | Discharge: 2017-07-01 | Disposition: A | Discharge status: Home / Self Care | Payer: Self-pay | Attending: Emergency Medicine | Admitting: Emergency Medicine

## 2017-07-01 ENCOUNTER — Encounter: Payer: Self-pay | Admitting: Emergency Medicine

## 2017-07-01 DIAGNOSIS — F17210 Nicotine dependence, cigarettes, uncomplicated: Secondary | ICD-10-CM | POA: Insufficient documentation

## 2017-07-01 DIAGNOSIS — K047 Periapical abscess without sinus: Secondary | ICD-10-CM | POA: Insufficient documentation

## 2017-07-01 MED ORDER — OXYCODONE-ACETAMINOPHEN 5-325 MG PO TABS
1.0000 | ORAL_TABLET | Freq: Once | ORAL | Status: AC
  Administered 2017-07-01: 1 via ORAL
  Filled 2017-07-01: qty 1

## 2017-07-01 MED ORDER — TRAMADOL HCL 50 MG PO TABS: 50.0000 mg | ORAL_TABLET | Freq: Four times a day (QID) | ORAL | 0 refills | Status: DC | PRN

## 2017-07-01 MED ORDER — PENICILLIN V POTASSIUM 500 MG PO TABS: 500.0000 mg | ORAL_TABLET | Freq: Four times a day (QID) | ORAL | 0 refills | Status: DC

## 2017-07-01 MED ORDER — CLINDAMYCIN PHOSPHATE 300 MG/2ML IJ SOLN
600.0000 mg | Freq: Once | INTRAMUSCULAR | Status: AC
  Administered 2017-07-01: 600 mg via INTRAMUSCULAR
  Filled 2017-07-01: qty 4

## 2017-07-01 NOTE — ED Triage Notes (Signed)
PT arrived with complaints of dental pain of the right lower tooth.

## 2017-07-01 NOTE — Discharge Instructions (Signed)
OPTIONS FOR DENTAL FOLLOW UP CARE ° °[place] Department of Health and Human Services - Local Safety Net Dental Clinics °http://www.ncdhhs.gov/dph/oralhealth/services/safetynetclinics.htm °  °[facility] (336-562-3123) ° °[facility] (919-933-9087) ° °[facility] ([phone] ext [phone]) ° °[facility] ([phone]) ° °[facility] ([phone]) °This clinic caters to the indigent population and is on a lottery system. °Location: °[facility], [address], [place] °Clinic Hours: °Wednesdays from 6pm - 9pm, patients seen by a lottery system. °For dates, call or go to [email or web address].[facility].edu/[facility]/patients/Dental-[facility] °Services: °Cleanings, fillings and simple extractions. °Payment Options: °DENTAL WORK IS FREE OF CHARGE. Bring proof of income or support. °Best way to get seen: °Arrive at 5:15 pm - this is a lottery, NOT first come/first serve, so arriving earlier will not increase your chances of being seen. °  °  °[facility] °[phone] °Select option 1 for emergencies °  °Location: °[facility], [address], [place] °Clinic Hours: °No walk-ins accepted - call the day before to schedule an appointment. °Check in times are 9:30 am and 1:30 pm. °Services: °Simple extractions, temporary fillings, pulpectomy/pulp debridement, uncomplicated abscess drainage. °Payment Options: °PAYMENT IS DUE AT THE TIME OF SERVICE.  Fee is usually $100-200, additional surgical procedures (e.g. abscess drainage) may be extra. °Cash, checks, Visa/MasterCard accepted.  Can file Medicaid if patient is covered for dental - patient should call case worker to check. °No discount for [facility] patients. °Best way to get seen: °MUST call the day before and get onto the schedule. Can usually be seen the next 1-2 days. No walk-ins accepted. °  °  °[facility] °[phone] °   °Location: °[facility], [address], [place] °Clinic Hours: °M, W, Th, F 8am or 1:30pm, Tues 9a or 1:30 - first come/first served. °Services: °Simple extractions, temporary fillings, uncomplicated abscess drainage.  You do not need to be an [place] resident. °Payment Options: °PAYMENT IS DUE AT THE TIME OF SERVICE. °Dental insurance, otherwise sliding scale - bring proof of income or support. °Depending on income and treatment needed, cost is usually $50-200. °Best way to get seen: °Arrive early as it is first come/first served. °  °  °[facility] °[phone] °  °Location: °[address] °Clinic Hours: °Mon-Thu 8a-5p °Services: °Most basic dental services including extractions and fillings. °Payment Options: °PAYMENT IS DUE AT THE TIME OF SERVICE. °Sliding scale, up to 50% off - bring proof if income or support. °Medicaid with dental option accepted. °Best way to get seen: °Call to schedule an appointment, can usually be seen within 2 weeks OR they will try to see walk-ins - show up at 8a or 2p (you may have to wait). °  °  °[facility] °[phone] °[place] RESIDENTS ONLY °  °Location: °[facility], [address], [place], [place] [address] °Clinic Hours: By appointment only. °Monday - Thursday 8am-5pm, Friday 8am-12pm °Services: Cleanings, fillings, extractions. °Payment Options: °PAYMENT IS DUE AT THE TIME OF SERVICE. °Cash, Visa or MasterCard. Sliding scale - $30 minimum per service. °Best way to get seen: °Come in to office, complete packet and make an appointment - need proof of income °or support monies for each household member and proof of [place] residence. °Usually takes about a month to get in. °  °  °[facility] °[phone] °  °Location: °[address]   [place] °Clinic Hours: Walk-in Urgent Care Dental Services are offered Monday-Friday  mornings only. °The numbers of emergencies accepted daily is limited to the number of °providers available. °Maximum 15 - Mondays, Wednesdays & Thursdays °Maximum 10 - Tuesdays & Fridays °Services: °You do not need to be a [place] resident to be seen for a dental emergency. °Emergencies are defined as pain, swelling, abnormal bleeding, or dental trauma. Walkins will receive x-rays if needed. °NOTE: Dental cleaning is not an emergency. °Payment Options: °PAYMENT IS DUE AT THE TIME OF SERVICE. °Minimum co-pay is $40.00 for uninsured patients. °Minimum co-pay is $3.00 for Medicaid with dental coverage. °Dental Insurance is accepted and must be presented at time of visit. °Medicare does not cover dental. °Forms of payment: Cash, credit card, checks. °Best way to get seen: °If not previously registered with the clinic, walk-in dental registration begins at 7:15 am and is on a first come/first serve basis. °If previously registered with the clinic, call to make an appointment. °  °  °[facility] °[phone] °[place] RESIDENTS ONLY °  °Location: °[address], [place], [place] °Clinic Hours: °Mon-Thu 10a-2p °Services: Extractions only! °Payment Options: °FREE (donations accepted) - bring proof of income or support °Best way to get seen: °Call and schedule an appointment OR come at 8am on the 1st Monday of every month (except for holidays) when it is first come/first served. °  °  °[facility] °[phone] °  °Location: °[address], [place] °Clinic Hours: °Friday mornings °Services, Payment Options, Best way to get seen: °Call for info °

## 2017-07-01 NOTE — ED Notes (Signed)
See triage note presents with possible dental abscess   Pain started couple of day ago

## 2017-07-01 NOTE — ED Provider Notes (Signed)
[facility] Emergency Department Provider Note  ____________________________________________  Time seen: Approximately 3:50 PM  I have reviewed the triage vital signs and the nursing notes.   HISTORY  Chief Complaint Dental Pain    HPI Yolanda Reed is a 42 y.o. female who presents the emergency department complaining of right lower dental pain.  Patient reports that she has a molar that had a filling that fell out approximately a year ago.  Patient has had 2 infections to the site and states that she needs to see a dentist but does not have the money to do so.  Patient is endorsing pain, swelling to the right lower dentition around the second molar.  She denies any drainage from the site.  No fevers or chills.  Patient reports pain with swallowing but no difficulty swallowing or breathing.  Patient denies any other complaints.  She is tried over-the-counter medications with no relief.    History reviewed. No pertinent past medical history.  There are no active problems to display for this patient.   Past Surgical History:  Procedure Laterality Date  . APPENDECTOMY    . CESAREAN SECTION      Prior to Admission medications   Medication Sig Start Date End Date Taking? Authorizing Provider  amoxicillin (AMOXIL) 500 MG capsule Take 1 capsule (500 mg total) by mouth 3 (three) times daily. 02/27/17   Tommi Rumps, PA-C  naproxen (NAPROSYN) 500 MG tablet Take 1 tablet (500 mg total) by mouth 2 (two) times daily with a meal. 02/27/17   Bridget Hartshorn L, PA-C  penicillin v potassium (VEETID) 500 MG tablet Take 1 tablet (500 mg total) by mouth 4 (four) times daily. 07/01/17   Cuthriell, Delorise Royals, PA-C  traMADol (ULTRAM) 50 MG tablet Take 1 tablet (50 mg total) by mouth every 6 (six) hours as needed. 07/01/17   Cuthriell, Delorise Royals, PA-C    Allergies Morphine and related  No family history on file.  Social History Social History   Tobacco Use  . Smoking  status: Current Every Day Smoker    Packs/day: 0.50    Types: Cigarettes  . Smokeless tobacco: Never Used  Substance Use Topics  . Alcohol use: No  . Drug use: Yes    Types: Marijuana    Comment: unsure the last time she smoked     Review of Systems  Constitutional: No fever/chills Eyes: No visual changes. No discharge ENT: Positive for right lower dental pain Cardiovascular: no chest pain. Respiratory: no cough. No SOB. Gastrointestinal: No abdominal pain.  No nausea, no vomiting.   Musculoskeletal: Negative for musculoskeletal pain. Skin: Negative for rash, abrasions, lacerations, ecchymosis. Neurological: Negative for headaches, focal weakness or numbness. 10-point ROS otherwise negative.  ____________________________________________   PHYSICAL EXAM:  VITAL SIGNS: ED Triage Vitals  Enc Vitals Group     BP 07/01/17 1540 (!) 156/75     Pulse Rate 07/01/17 1540 66     Resp 07/01/17 1540 18     Temp 07/01/17 1540 99.3 F (37.4 C)     Temp Source 07/01/17 1540 Oral     SpO2 07/01/17 1540 99 %     Weight 07/01/17 1539 150 lb (68 kg)     Height 07/01/17 1539 5\' 3"  (1.6 m)     Head Circumference --      Peak Flow --      Pain Score 07/01/17 1539 8     Pain Loc --  Pain Edu? --      Excl. in GC? --      Constitutional: Alert and oriented. Well appearing and in no acute distress. Eyes: Conjunctivae are normal. PERRL. EOMI. Head: Atraumatic. ENT:      Ears:       Nose: No congestion/rhinnorhea.      Mouth/Throat: Mucous membranes are moist.  Visualization of dentition reveals some erosion and scattered caries.  Of concern, tooth #31 has erosion, obvious caries with surrounding erythema and edema.  No fluctuance with depression of tongue depressor concerning for drainable abscess.  Palpation along the jawline reveals no fluctuance or induration.  Firmness in same region of erythema is appreciated on palpation of the external jawline.  Uvula is midline. Neck: No  stridor.   Hematological/Lymphatic/Immunilogical: No cervical lymphadenopathy. Cardiovascular: Normal rate, regular rhythm. Normal S1 and S2.  Good peripheral circulation. Respiratory: Normal respiratory effort without tachypnea or retractions. Lungs CTAB. Good air entry to the bases with no decreased or absent breath sounds. Musculoskeletal: Full range of motion to all extremities. No gross deformities appreciated. Neurologic:  Normal speech and language. No gross focal neurologic deficits are appreciated.  Skin:  Skin is warm, dry and intact. No rash noted. Psychiatric: Mood and affect are normal. Speech and behavior are normal. Patient exhibits appropriate insight and judgement.   ____________________________________________   LABS (all labs ordered are listed, but only abnormal results are displayed)  Labs Reviewed - No data to display ____________________________________________  EKG   ____________________________________________  RADIOLOGY   No results found.  ____________________________________________    PROCEDURES  Procedure(s) performed:    Procedures    Medications  clindamycin (CLEOCIN) injection 600 mg (has no administration in time range)  oxyCODONE-acetaminophen (PERCOCET/ROXICET) 5-325 MG per tablet 1 tablet (has no administration in time range)     ____________________________________________   INITIAL IMPRESSION / ASSESSMENT AND PLAN / ED COURSE  Pertinent labs & imaging results that were available during my care of the patient were reviewed by me and considered in my medical decision making (see chart for details).  Review of the [place] CSRS was performed in accordance of the [facility] prior to dispensing any controlled drugs.      Patient's diagnosis is consistent with dental infection.  Patient presents emergency department complaining of pain to the right lower dentition.  Visualization is consistent with dental abscess.  No indication for  incision and drainage.  No indication for labs or imaging at this time.  Patient is given injection of clindamycin here in the emergency department.. Patient will be discharged home with prescriptions for amoxicillin, limited prescription for pain medicine.  Dental follow-up options are provided to patient.. Patient is to follow up with dentist as needed or otherwise directed. Patient is given ED precautions to return to the ED for any worsening or new symptoms.     ____________________________________________  FINAL CLINICAL IMPRESSION(S) / ED DIAGNOSES  Final diagnoses:  Dental infection      NEW MEDICATIONS STARTED DURING THIS VISIT:  ED Discharge Orders        Ordered    penicillin v potassium (VEETID) 500 MG tablet  4 times daily     07/01/17 1601    traMADol (ULTRAM) 50 MG tablet  Every 6 hours PRN     07/01/17 1601          This chart was dictated using voice recognition software/Dragon. Despite best efforts to proofread, errors can occur which can change the meaning. Any change was purely  unintentional.    Racheal Patches, PA-C 07/01/17 1601    Myrna Blazer, MD 07/02/17 2329

## 2018-01-17 ENCOUNTER — Emergency Department: Payer: Self-pay

## 2018-01-17 ENCOUNTER — Emergency Department
Admission: EM | Admit: 2018-01-17 | Discharge: 2018-01-17 | Disposition: A | Discharge status: Home / Self Care | Payer: Self-pay | Attending: Emergency Medicine | Admitting: Emergency Medicine

## 2018-01-17 ENCOUNTER — Encounter: Payer: Self-pay | Admitting: Emergency Medicine

## 2018-01-17 DIAGNOSIS — S92251A Displaced fracture of navicular [scaphoid] of right foot, initial encounter for closed fracture: Secondary | ICD-10-CM | POA: Insufficient documentation

## 2018-01-17 DIAGNOSIS — Y9301 Activity, walking, marching and hiking: Secondary | ICD-10-CM | POA: Insufficient documentation

## 2018-01-17 DIAGNOSIS — W108XXA Fall (on) (from) other stairs and steps, initial encounter: Secondary | ICD-10-CM | POA: Insufficient documentation

## 2018-01-17 DIAGNOSIS — Y999 Unspecified external cause status: Secondary | ICD-10-CM | POA: Insufficient documentation

## 2018-01-17 DIAGNOSIS — F17210 Nicotine dependence, cigarettes, uncomplicated: Secondary | ICD-10-CM | POA: Insufficient documentation

## 2018-01-17 DIAGNOSIS — Z79899 Other long term (current) drug therapy: Secondary | ICD-10-CM | POA: Insufficient documentation

## 2018-01-17 DIAGNOSIS — T148XXA Other injury of unspecified body region, initial encounter: Secondary | ICD-10-CM

## 2018-01-17 DIAGNOSIS — Y929 Unspecified place or not applicable: Secondary | ICD-10-CM | POA: Insufficient documentation

## 2018-01-17 DIAGNOSIS — S92352A Displaced fracture of fifth metatarsal bone, left foot, initial encounter for closed fracture: Secondary | ICD-10-CM | POA: Insufficient documentation

## 2018-01-17 MED ORDER — TRAMADOL HCL 50 MG PO TABS: 50.0000 mg | ORAL_TABLET | Freq: Four times a day (QID) | ORAL | 0 refills | Status: AC | PRN

## 2018-01-17 MED ORDER — TRAMADOL HCL 50 MG PO TABS
50.0000 mg | ORAL_TABLET | Freq: Once | ORAL | Status: AC
  Administered 2018-01-17: 50 mg via ORAL
  Filled 2018-01-17: qty 1

## 2018-01-17 MED ORDER — IBUPROFEN 600 MG PO TABS: 600.0000 mg | ORAL_TABLET | Freq: Four times a day (QID) | ORAL | 0 refills | Status: DC | PRN

## 2018-01-17 MED ORDER — IBUPROFEN 600 MG PO TABS
600.0000 mg | ORAL_TABLET | Freq: Once | ORAL | Status: AC
  Administered 2018-01-17: 600 mg via ORAL
  Filled 2018-01-17: qty 1

## 2018-01-17 NOTE — Discharge Instructions (Signed)
You have 2 small fractures and one larger fracture of your right foot.  Please wear splint until seen by podiatry.  Use crutches and do not put any weight on this foot.  Please call podiatry in the morning for an appointment early this week.  You may take tramadol for extreme pain.

## 2018-01-17 NOTE — ED Provider Notes (Signed)
[facility] Emergency Department Provider Note  ____________________________________________  Time seen: Approximately 7:38 AM  I have reviewed the triage vital signs and the nursing notes.   HISTORY  Chief Complaint Foot Pain    HPI Yolanda Reed is a 43 y.o. female that presents the emergency department for evaluation of right foot pain after falling this morning.  Patient states that she tripped on the stairs and twisted her foot inwards.  She has not been able to bear weight since.  She did not hit her head or lose consciousness.  No additional injuries.  History reviewed. No pertinent past medical history.  There are no active problems to display for this patient.   Past Surgical History:  Procedure Laterality Date  . APPENDECTOMY    . CESAREAN SECTION      Prior to Admission medications   Medication Sig Start Date End Date Taking? Authorizing Provider  amoxicillin (AMOXIL) 500 MG capsule Take 1 capsule (500 mg total) by mouth 3 (three) times daily. 02/27/17   Tommi Rumps, PA-C  ibuprofen (ADVIL,MOTRIN) 600 MG tablet Take 1 tablet (600 mg total) by mouth every 6 (six) hours as needed. 01/17/18   Enid Derry, PA-C  naproxen (NAPROSYN) 500 MG tablet Take 1 tablet (500 mg total) by mouth 2 (two) times daily with a meal. 02/27/17   Bridget Hartshorn L, PA-C  penicillin v potassium (VEETID) 500 MG tablet Take 1 tablet (500 mg total) by mouth 4 (four) times daily. 07/01/17   Cuthriell, Delorise Royals, PA-C  traMADol (ULTRAM) 50 MG tablet Take 1 tablet (50 mg total) by mouth every 6 (six) hours as needed. 01/17/18 01/17/19  Enid Derry, PA-C    Allergies Morphine and related  No family history on file.  Social History Social History   Tobacco Use  . Smoking status: Current Every Day Smoker    Packs/day: 0.50    Types: Cigarettes  . Smokeless tobacco: Never Used  Substance Use Topics  . Alcohol use: No  . Drug use: Yes    Types: Marijuana      Review of Systems  Respiratory: No SOB. Gastrointestinal: No nausea, no vomiting.  Musculoskeletal: Positive for foot pain. Skin: Negative for rash, abrasions, lacerations. positive for ecchymosis.   ____________________________________________   PHYSICAL EXAM:  VITAL SIGNS: ED Triage Vitals [01/17/18 0428]  Enc Vitals Group     BP 118/73     Pulse Rate (!) 118     Resp 18     Temp 99.3 F (37.4 C)     Temp Source Oral     SpO2 98 %     Weight 140 lb (63.5 kg)     Height 5\' 3"  (1.6 m)     Head Circumference      Peak Flow      Pain Score 8     Pain Loc      Pain Edu?      Excl. in GC?      Constitutional: Alert and oriented. Well appearing and in no acute distress. Eyes: Conjunctivae are normal. PERRL. EOMI. Head: Atraumatic. ENT:      Ears:      Nose: No congestion/rhinnorhea.      Mouth/Throat: Mucous membranes are moist.  Neck: No stridor.   Cardiovascular: Normal rate, regular rhythm.  Good peripheral circulation.  Symmetric dorsalis pedis pulses bilaterally.  Cap refill less than 2 seconds. Respiratory: Normal respiratory effort without tachypnea or retractions. Lungs CTAB. Good air entry to  the bases with no decreased or absent breath sounds. Musculoskeletal: Full range of motion to all extremities. No gross deformities appreciated.  Ecchymosis and tenderness to palpation to lateral right foot. Neurologic:  Normal speech and language. No gross focal neurologic deficits are appreciated.  Skin:  Skin is warm, dry and intact. No rash noted. Psychiatric: Mood and affect are normal. Speech and behavior are normal. Patient exhibits appropriate insight and judgement.   ____________________________________________   LABS (all labs ordered are listed, but only abnormal results are displayed)  Labs Reviewed - No data to display ____________________________________________  EKG   ____________________________________________  RADIOLOGY Lexine Baton, personally viewed and evaluated these images (plain radiographs) as part of my medical decision making, as well as reviewing the written report by the radiologist.  Dg Foot Complete Right  Result Date: 01/17/2018 CLINICAL DATA:  Patient with pain at the fifth metatarsal. Initial encounter. EXAM: RIGHT FOOT COMPLETE - 3+ VIEW COMPARISON:  None. FINDINGS: There is an oblique mildly displaced fracture through the mid aspect of the fifth metatarsal with overlying soft tissue swelling. On the lateral view there is a small crescentic lucency along the dorsal anterior aspect of the navicular. Additionally, there is a small crescentic ossification about the lateral aspect of the cuboid bone. Plantar calcaneal spurring. Bipartite medial and lateral sesamoid bones. IMPRESSION: 1. Oblique mildly displaced fracture of the mid aspect of the fifth metatarsal with overlying soft tissue swelling. 2. Small crescentic calcific density about the lateral aspect of the cuboid bone concerning for avulsion fracture. 3. Small crescentic density along the dorsal aspect of the foot along the anterior aspect of the navicular concerning for small avulsion injury. Electronically Signed   By: Annia Belt M.D.   On: 01/17/2018 05:03    ____________________________________________    PROCEDURES  Procedure(s) performed:    Procedures    Medications  traMADol (ULTRAM) tablet 50 mg (50 mg Oral Given 01/17/18 0805)  ibuprofen (ADVIL,MOTRIN) tablet 600 mg (600 mg Oral Given 01/17/18 0805)     ____________________________________________   INITIAL IMPRESSION / ASSESSMENT AND PLAN / ED COURSE  Pertinent labs & imaging results that were available during my care of the patient were reviewed by me and considered in my medical decision making (see chart for details).  Review of the [place] CSRS was performed in accordance of the [facility] prior to dispensing any controlled drugs.     Patient's diagnosis is consistent with  fifth metatarsal fracture, avulsion fracture of the cuboid and avulsion fracture of the navicular.  Vital signs and exam are reassuring.  X-ray consistent with mildly displaced fifth metatarsal fracture and avulsion fractures.  Posterior splint was placed.  Crutches were given.  Friend is in the room with the patient and they are agreeable to call podiatry in the morning for an appointment this week.  Patient will be discharged home with prescriptions for tramadol and ibuprofen.  Patient is to follow up with podiatry as directed. Patient is given ED precautions to return to the ED for any worsening or new symptoms.     ____________________________________________  FINAL CLINICAL IMPRESSION(S) / ED DIAGNOSES  Final diagnoses:  Displaced fracture of fifth metatarsal bone, left foot, initial encounter for closed fracture  Closed avulsion fracture of navicular bone of right foot, initial encounter  Avulsion fracture      NEW MEDICATIONS STARTED DURING THIS VISIT:  ED Discharge Orders         Ordered    traMADol (ULTRAM) 50 MG tablet  Every 6 hours PRN     01/17/18 0831    ibuprofen (ADVIL,MOTRIN) 600 MG tablet  Every 6 hours PRN     01/17/18 0831              This chart was dictated using voice recognition software/Dragon. Despite best efforts to proofread, errors can occur which can change the meaning. Any change was purely unintentional.    Enid DerryWagner, Greenly Rarick, PA-C 01/17/18 1548    Rockne MenghiniNorman, Anne-Caroline, MD 01/18/18 [phone]

## 2018-01-17 NOTE — ED Notes (Signed)
See triage note  Presents with pain and injury to right foot  States she ripped down last couple of steps  Very tearful when placed in room

## 2018-01-17 NOTE — ED Triage Notes (Signed)
Patient states that she tripped down the bottom step and now has pain and swelling to her right foot.

## 2018-01-17 NOTE — ED Notes (Signed)
Patient transported to X-ray 

## 2018-03-21 ENCOUNTER — Encounter: Payer: Self-pay | Admitting: Emergency Medicine

## 2018-03-21 ENCOUNTER — Emergency Department
Admission: EM | Admit: 2018-03-21 | Discharge: 2018-03-21 | Disposition: A | Discharge status: Home / Self Care | Payer: Self-pay | Attending: Emergency Medicine | Admitting: Emergency Medicine

## 2018-03-21 DIAGNOSIS — K047 Periapical abscess without sinus: Secondary | ICD-10-CM | POA: Insufficient documentation

## 2018-03-21 DIAGNOSIS — F17210 Nicotine dependence, cigarettes, uncomplicated: Secondary | ICD-10-CM | POA: Insufficient documentation

## 2018-03-21 MED ORDER — AMOXICILLIN 500 MG PO CAPS: 500.0000 mg | ORAL_CAPSULE | Freq: Three times a day (TID) | ORAL | 0 refills | Status: DC

## 2018-03-21 MED ORDER — LIDOCAINE VISCOUS HCL 2 % MT SOLN
15.0000 mL | Freq: Once | OROMUCOSAL | Status: AC
  Administered 2018-03-21: 15 mL via OROMUCOSAL
  Filled 2018-03-21: qty 15

## 2018-03-21 MED ORDER — LIDOCAINE VISCOUS HCL 2 % MT SOLN
10.0000 mL | OROMUCOSAL | 0 refills | Status: DC | PRN
Start: 2018-03-21 — End: 2020-06-05

## 2018-03-21 MED ORDER — KETOROLAC TROMETHAMINE 30 MG/ML IJ SOLN
30.0000 mg | Freq: Once | INTRAMUSCULAR | Status: AC
  Administered 2018-03-21: 30 mg via INTRAMUSCULAR
  Filled 2018-03-21: qty 1

## 2018-03-21 NOTE — ED Provider Notes (Signed)
[facility] Emergency Department Provider Note  ____________________________________________  Time seen: Approximately 9:00 AM  I have reviewed the triage vital signs and the nursing notes.   HISTORY  Chief Complaint Dental Pain    HPI Yolanda Reed is a 43 y.o. female that presents to the emergency department for evaluation of left upper dental pain worse for several days.  Patient states that she wakes up with her tooth hurting.  She has felt chills.  No swelling.  She does not have a dentist.   History reviewed. No pertinent past medical history.  There are no active problems to display for this patient.   Past Surgical History:  Procedure Laterality Date  . APPENDECTOMY    . CESAREAN SECTION      Prior to Admission medications   Medication Sig Start Date End Date Taking? Authorizing Provider  amoxicillin (AMOXIL) 500 MG capsule Take 1 capsule (500 mg total) by mouth 3 (three) times daily. 03/21/18   Enid Derry, PA-C  ibuprofen (ADVIL,MOTRIN) 600 MG tablet Take 1 tablet (600 mg total) by mouth every 6 (six) hours as needed. 01/17/18   Enid Derry, PA-C  lidocaine (XYLOCAINE) 2 % solution Use as directed 10 mLs in the mouth or throat as needed. 03/21/18   Enid Derry, PA-C  naproxen (NAPROSYN) 500 MG tablet Take 1 tablet (500 mg total) by mouth 2 (two) times daily with a meal. 02/27/17   Bridget Hartshorn L, PA-C  penicillin v potassium (VEETID) 500 MG tablet Take 1 tablet (500 mg total) by mouth 4 (four) times daily. 07/01/17   Cuthriell, Delorise Royals, PA-C  traMADol (ULTRAM) 50 MG tablet Take 1 tablet (50 mg total) by mouth every 6 (six) hours as needed. 01/17/18 01/17/19  Enid Derry, PA-C    Allergies Morphine and related  No family history on file.  Social History Social History   Tobacco Use  . Smoking status: Current Every Day Smoker    Packs/day: 0.50    Types: Cigarettes  . Smokeless tobacco: Never Used  Substance Use Topics  .  Alcohol use: No  . Drug use: Yes    Types: Marijuana     Review of Systems  Constitutional: Positive for chills. ENT: No upper respiratory complaints. Respiratory: No SOB. Gastrointestinal: No nausea, no vomiting.  Musculoskeletal: Negative for musculoskeletal pain. Skin: Negative for rash, abrasions, lacerations, ecchymosis.   ____________________________________________   PHYSICAL EXAM:  VITAL SIGNS: ED Triage Vitals  Enc Vitals Group     BP 03/21/18 0830 132/81     Pulse Rate 03/21/18 0830 72     Resp 03/21/18 0830 16     Temp 03/21/18 0830 98.3 F (36.8 C)     Temp Source 03/21/18 0830 Oral     SpO2 03/21/18 0830 100 %     Weight 03/21/18 0828 140 lb (63.5 kg)     Height 03/21/18 0828 5\' 4"  (1.626 m)     Head Circumference --      Peak Flow --      Pain Score 03/21/18 0828 8     Pain Loc --      Pain Edu? --      Excl. in GC? --      Constitutional: Alert and oriented. Well appearing and in no acute distress. Eyes: Conjunctivae are normal. PERRL. EOMI. Head: Atraumatic. ENT:      Ears:      Nose: No congestion/rhinnorhea.      Mouth/Throat: Mucous membranes are moist.  Large cavity to left back top molar.  No swelling.  Full range of motion of jaw. Neck: No stridor.   Cardiovascular: Normal rate, regular rhythm.  Good peripheral circulation. Respiratory: Normal respiratory effort without tachypnea or retractions. Lungs CTAB. Good air entry to the bases with no decreased or absent breath sounds. Musculoskeletal: Full range of motion to all extremities. No gross deformities appreciated. Neurologic:  Normal speech and language. No gross focal neurologic deficits are appreciated.  Skin:  Skin is warm, dry and intact. No rash noted. Psychiatric: Mood and affect are normal. Speech and behavior are normal. Patient exhibits appropriate insight and judgement.   ____________________________________________   LABS (all labs ordered are listed, but only abnormal  results are displayed)  Labs Reviewed - No data to display ____________________________________________  EKG   ____________________________________________  RADIOLOGY   No results found.  ____________________________________________    PROCEDURES  Procedure(s) performed:    Procedures    Medications  ketorolac (TORADOL) 30 MG/ML injection 30 mg (30 mg Intramuscular Given 03/21/18 0916)  lidocaine (XYLOCAINE) 2 % viscous mouth solution 15 mL (15 mLs Mouth/Throat Given 03/21/18 0918)     ____________________________________________   INITIAL IMPRESSION / ASSESSMENT AND PLAN / ED COURSE  Pertinent labs & imaging results that were available during my care of the patient were reviewed by me and considered in my medical decision making (see chart for details).  Review of the [place] CSRS was performed in accordance of the N[facility] prior to dispensing any controlled drugs.     Patient's diagnosis is consistent with dental infection.  Patient will be discharged home with prescriptions for amoxicillin and viscous lidocaine.  Patient is to follow up with dentist as directed. Dental resources were given. Patient is given ED precautions to return to the ED for any worsening or new symptoms.     ____________________________________________  FINAL CLINICAL IMPRESSION(S) / ED DIAGNOSES  Final diagnoses:  Dental infection      NEW MEDICATIONS STARTED DURING THIS VISIT:  ED Discharge Orders         Ordered    amoxicillin (AMOXIL) 500 MG capsule  3 times daily     03/21/18 0944    lidocaine (XYLOCAINE) 2 % solution  As needed     03/21/18 0944              This chart was dictated using voice recognition software/Dragon. Despite best efforts to proofread, errors can occur which can change the meaning. Any change was purely unintentional.    Enid Derry, PA-C 03/21/18 1124    Emily Filbert, MD 03/21/18 [phone]

## 2018-03-21 NOTE — ED Triage Notes (Signed)
Pt reports dental pain to left upper jaw. Pt states has been hurting awhile and last time they had to give her a shot.

## 2018-03-21 NOTE — Discharge Instructions (Signed)
OPTIONS FOR DENTAL FOLLOW UP CARE ° °[place] [facility] - Local Safety Net Dental Clinics °http://www.ncdhhs.gov/dph/oralhealth/services/safetynetclinics.htm °  °[facility] (336-562-3123) ° °[facility] [place] ([phone]) ° °[facility] ([phone] ext [phone]) ° °[facility] ([phone]) ° °[facility] ([phone]) °This clinic caters to the indigent population and is on a lottery system. °Location: °[facility], [address], [place] °Clinic Hours: °Wednesdays from 6pm - 9pm, patients seen by a lottery system. °For dates, call or go to www.med.[facility].edu/[facility]/patients/Dental-[facility] °Services: °Cleanings, fillings and simple extractions. °Payment Options: °DENTAL WORK IS FREE OF CHARGE. Bring proof of income or support. °Best way to get seen: °Arrive at 5:15 pm - this is a lottery, NOT first come/first serve, so arriving earlier will not increase your chances of being seen. °  °  °[facility] °[phone] °Select option 1 for emergencies °  °Location: °[facility], [address], [place] °Clinic Hours: °No walk-ins accepted - call the day before to schedule an appointment. °Check in times are 9:30 am and 1:30 pm. °Services: °Simple extractions, temporary fillings, pulpectomy/pulp debridement, uncomplicated abscess drainage. °Payment Options: °PAYMENT IS DUE AT THE TIME OF SERVICE.  Fee is usually $100-200, additional surgical procedures (e.g. abscess drainage) may be extra. °Cash, checks, Visa/MasterCard accepted.  Can file Medicaid if patient is covered for dental - patient should call case worker to check. °No discount for [facility] patients. °Best way to get seen: °MUST call the day before and get onto the schedule. Can usually be seen the next 1-2 days. No walk-ins accepted. °  °  °[facility] °[phone] °   °Location: °[facility], [address], [place] °Clinic Hours: °M, W, Th, F 8am or 1:30pm, Tues 9a or 1:30 - first come/first served. °Services: °Simple extractions, temporary fillings, uncomplicated abscess drainage.  You do not need to be an [place] resident. °Payment Options: °PAYMENT IS DUE AT THE TIME OF SERVICE. °Dental insurance, otherwise sliding scale - bring proof of income or support. °Depending on income and treatment needed, cost is usually $50-200. °Best way to get seen: °Arrive early as it is first come/first served. °  °  °[facility] °[phone] °  °Location: °[address] °Clinic Hours: °Mon-Thu 8a-5p °Services: °Most basic dental services including extractions and fillings. °Payment Options: °PAYMENT IS DUE AT THE TIME OF SERVICE. °Sliding scale, up to 50% off - bring proof if income or support. °Medicaid with dental option accepted. °Best way to get seen: °Call to schedule an appointment, can usually be seen within 2 weeks OR they will try to see walk-ins - show up at 8a or 2p (you may have to wait). °  °  °[facility] °919-245-2435 °[place] RESIDENTS ONLY °  °Location: °[facility], [address], [place], [place] [address] °Clinic Hours: By appointment only. °Monday - Thursday 8am-5pm, Friday 8am-12pm °Services: Cleanings, fillings, extractions. °Payment Options: °PAYMENT IS DUE AT THE TIME OF SERVICE. °Cash, Visa or MasterCard. Sliding scale - $30 minimum per service. °Best way to get seen: °Come in to office, complete packet and make an appointment - need proof of income °or support monies for each household member and proof of [place] residence. °Usually takes about a month to get in. °  °  °[facility] °[phone] °  °Location: °[address]   [place] °Clinic Hours: Walk-in Urgent Care Dental Services are offered Monday-Friday  mornings only. °The numbers of emergencies accepted daily is limited to the number of °providers available. °Maximum 15 - Mondays, Wednesdays & Thursdays °Maximum 10 - Tuesdays & Fridays °Services: °You do not need to be a [place] resident to be seen for a dental emergency. °Emergencies are defined as pain, swelling, abnormal bleeding, or dental trauma. Walkins will receive x-rays if needed. °NOTE: Dental cleaning is not an emergency. °Payment Options: °PAYMENT IS DUE AT THE TIME OF SERVICE. °Minimum co-pay is $40.00 for uninsured patients. °Minimum co-pay is $3.00 for Medicaid with dental coverage. °Dental Insurance is accepted and must be presented at time of visit. °Medicare does not cover dental. °Forms of payment: Cash, credit card, checks. °Best way to get seen: °If not previously registered with the clinic, walk-in dental registration begins at 7:15 am and is on a first come/first serve basis. °If previously registered with the clinic, call to make an appointment. °  °  °[facility] °[phone] °[place] RESIDENTS ONLY °  °Location: °[address], [place], [place] °Clinic Hours: °Mon-Thu 10a-2p °Services: Extractions only! °Payment Options: °FREE (donations accepted) - bring proof of income or support °Best way to get seen: °Call and schedule an appointment OR come at 8am on the 1st Monday of every month (except for holidays) when it is first come/first served. °  °  °[facility] °[phone] °  °Location: °[address], [place] °Clinic Hours: °Friday mornings °Services, Payment Options, Best way to get seen: °Call for info °

## 2018-07-26 DIAGNOSIS — Z9151 Personal history of suicidal behavior: Secondary | ICD-10-CM

## 2018-07-26 DIAGNOSIS — F1910 Other psychoactive substance abuse, uncomplicated: Secondary | ICD-10-CM

## 2018-07-26 DIAGNOSIS — B009 Herpesviral infection, unspecified: Secondary | ICD-10-CM

## 2018-07-26 DIAGNOSIS — Z915 Personal history of self-harm: Secondary | ICD-10-CM

## 2018-07-28 ENCOUNTER — Ambulatory Visit (LOCAL_COMMUNITY_HEALTH_CENTER): Payer: Self-pay

## 2018-07-28 ENCOUNTER — Other Ambulatory Visit: Payer: Self-pay

## 2018-07-28 DIAGNOSIS — Z113 Encounter for screening for infections with a predominantly sexual mode of transmission: Secondary | ICD-10-CM

## 2018-07-28 LAB — WET PREP FOR TRICH, YEAST, CLUE
Trichomonas Exam: NEGATIVE
Yeast Exam: NEGATIVE

## 2018-07-28 NOTE — Progress Notes (Signed)
Patient wet mount reviewed, no treatment indicated.Jenetta Downer, RN

## 2018-07-28 NOTE — Progress Notes (Signed)
    STI clinic/screening visit  Subjective:  Yolanda Reed is a 43 y.o. female being seen today for an STI screening visit. The patient reports they do have symptoms.  Patient has the following medical conditionshas Herpes simplex disease; Polysubstance abuse ([place]); and History of attempted suicide on their problem list.  Chief Complaint  Patient presents with  . Exposure to STD    Patient reports  HPI intermittent dysuria "for a long time".  Is in monogamous relationship   See flowsheet for further details and programmatic requirements.    The following portions of the patient's history were reviewed and updated as appropriate: allergies, current medications, past family history, past medical history, past social history, past surgical history and problem list. Problem list updated.  Objective:  There were no vitals filed for this visit.  Physical Exam Constitutional:      Appearance: Normal appearance.  HENT:     Mouth/Throat:     Mouth: Mucous membranes are moist.     Pharynx: Oropharynx is clear. No oropharyngeal exudate or posterior oropharyngeal erythema.  Neck:     Musculoskeletal: No muscular tenderness.  Abdominal:     Palpations: Abdomen is soft.     Tenderness: There is no abdominal tenderness.  Genitourinary:    General: Normal vulva.     Vagina: Vaginal discharge present.     Comments: sm amt of white disch, + mild odor, ph> 4.5 Bimanual declined by client Lymphadenopathy:     Cervical: No cervical adenopathy.  Neurological:     Mental Status: She is alert.       Assessment and Plan:  Yolanda Reed is a 43 y.o. female presenting to the [facility] Department for STI screening  There are no diagnoses linked to this encounter. STD screen: Labs pending Wet prep-Treat for BV if + for clues/amine as per SO. ( negative results) -Co client to d/c soap to ext. Genitalia-use water only.     No follow-ups on file.  No future  appointments.  Hassell Done, FNP

## 2018-07-28 NOTE — Progress Notes (Signed)
Patient here for STD testing.Kaelynn Igo Brewer-Jensen, RN 

## 2018-08-12 ENCOUNTER — Telehealth: Payer: Self-pay | Admitting: Nurse Practitioner

## 2018-08-12 ENCOUNTER — Encounter: Payer: Self-pay | Admitting: Nurse Practitioner

## 2018-08-12 DIAGNOSIS — B182 Chronic viral hepatitis C: Secondary | ICD-10-CM | POA: Insufficient documentation

## 2018-08-12 NOTE — Telephone Encounter (Signed)
Left message to patient to discuss Hep C results.

## 2018-08-16 NOTE — Addendum Note (Signed)
Addended by: Hassell Done on: 08/16/2018 08:40 AM   Modules accepted: Orders

## 2019-06-13 ENCOUNTER — Emergency Department
Admission: EM | Admit: 2019-06-13 | Discharge: 2019-06-14 | Disposition: A | Discharge status: Home / Self Care | Payer: Self-pay | Attending: Emergency Medicine | Admitting: Emergency Medicine

## 2019-06-13 ENCOUNTER — Emergency Department: Payer: Self-pay

## 2019-06-13 ENCOUNTER — Other Ambulatory Visit: Payer: Self-pay

## 2019-06-13 ENCOUNTER — Encounter: Payer: Self-pay | Admitting: Emergency Medicine

## 2019-06-13 DIAGNOSIS — F17210 Nicotine dependence, cigarettes, uncomplicated: Secondary | ICD-10-CM | POA: Insufficient documentation

## 2019-06-13 DIAGNOSIS — K529 Noninfective gastroenteritis and colitis, unspecified: Secondary | ICD-10-CM | POA: Insufficient documentation

## 2019-06-13 DIAGNOSIS — Z20822 Contact with and (suspected) exposure to covid-19: Secondary | ICD-10-CM | POA: Insufficient documentation

## 2019-06-13 DIAGNOSIS — F1210 Cannabis abuse, uncomplicated: Secondary | ICD-10-CM | POA: Insufficient documentation

## 2019-06-13 DIAGNOSIS — R1011 Right upper quadrant pain: Secondary | ICD-10-CM | POA: Insufficient documentation

## 2019-06-13 DIAGNOSIS — R110 Nausea: Secondary | ICD-10-CM | POA: Insufficient documentation

## 2019-06-13 DIAGNOSIS — R509 Fever, unspecified: Secondary | ICD-10-CM | POA: Insufficient documentation

## 2019-06-13 LAB — URINALYSIS, COMPLETE (UACMP) WITH MICROSCOPIC
Bilirubin Urine: NEGATIVE
Glucose, UA: NEGATIVE mg/dL
Hgb urine dipstick: NEGATIVE
Ketones, ur: NEGATIVE mg/dL
Nitrite: NEGATIVE
Protein, ur: NEGATIVE mg/dL
Specific Gravity, Urine: 1.025 (ref 1.005–1.030)
pH: 6 (ref 5.0–8.0)

## 2019-06-13 LAB — COMPREHENSIVE METABOLIC PANEL
ALT: 33 U/L (ref 0–44)
AST: 27 U/L (ref 15–41)
Albumin: 3.8 g/dL (ref 3.5–5.0)
Alkaline Phosphatase: 72 U/L (ref 38–126)
Anion gap: 9 (ref 5–15)
BUN: 10 mg/dL (ref 6–20)
CO2: 23 mmol/L (ref 22–32)
Calcium: 8.5 mg/dL — ABNORMAL LOW (ref 8.9–10.3)
Chloride: 103 mmol/L (ref 98–111)
Creatinine, Ser: 0.49 mg/dL (ref 0.44–1.00)
GFR calc Af Amer: >60 mL/min (ref >60)
GFR calc non Af Amer: >60 mL/min (ref >60)
Glucose, Bld: 118 mg/dL — ABNORMAL HIGH (ref 70–99)
Potassium: 3.3 mmol/L — ABNORMAL LOW (ref 3.5–5.1)
Sodium: 135 mmol/L (ref 135–145)
Total Bilirubin: 0.6 mg/dL (ref 0.3–1.2)
Total Protein: 7 g/dL (ref 6.5–8.1)

## 2019-06-13 LAB — CBC
HCT: 37.5 % (ref 36.0–46.0)
Hemoglobin: 12.2 g/dL (ref 12.0–15.0)
MCH: 30.2 pg (ref 26.0–34.0)
MCHC: 32.5 g/dL (ref 30.0–36.0)
MCV: 92.8 fL (ref 80.0–100.0)
Platelets: 307 10*3/uL (ref 150–400)
RBC: 4.04 MIL/uL (ref 3.87–5.11)
RDW: 13 % (ref 11.5–15.5)
WBC: 13 10*3/uL — ABNORMAL HIGH (ref 4.0–10.5)
nRBC: 0 % (ref 0.0–0.2)

## 2019-06-13 LAB — SARS CORONAVIRUS 2 BY RT PCR (HOSPITAL ORDER, PERFORMED IN ~~LOC~~ HOSPITAL LAB): SARS Coronavirus 2: NEGATIVE

## 2019-06-13 LAB — LIPASE, BLOOD: Lipase: 22 U/L (ref 11–51)

## 2019-06-13 LAB — LACTIC ACID, PLASMA: Lactic Acid, Venous: 1.4 mmol/L (ref 0.5–1.9)

## 2019-06-13 LAB — PREGNANCY, URINE: Preg Test, Ur: NEGATIVE

## 2019-06-13 MED ORDER — METRONIDAZOLE 500 MG PO TABS
500.0000 mg | ORAL_TABLET | Freq: Three times a day (TID) | ORAL | 0 refills | Status: AC
Start: 2019-06-13 — End: 2019-06-20

## 2019-06-13 MED ORDER — HYDROMORPHONE HCL 1 MG/ML IJ SOLN
0.5000 mg | Freq: Once | INTRAMUSCULAR | Status: AC
  Administered 2019-06-13: 0.5 mg via INTRAVENOUS
  Filled 2019-06-13: qty 1

## 2019-06-13 MED ORDER — METRONIDAZOLE IN NACL 5-0.79 MG/ML-% IV SOLN
500.0000 mg | Freq: Once | INTRAVENOUS | Status: AC
  Administered 2019-06-13: 500 mg via INTRAVENOUS
  Filled 2019-06-13: qty 100

## 2019-06-13 MED ORDER — SODIUM CHLORIDE 0.9 % IV BOLUS
1000.0000 mL | Freq: Once | INTRAVENOUS | Status: AC
  Administered 2019-06-13: 1000 mL via INTRAVENOUS

## 2019-06-13 MED ORDER — KETOROLAC TROMETHAMINE 30 MG/ML IJ SOLN
15.0000 mg | Freq: Once | INTRAMUSCULAR | Status: AC
  Administered 2019-06-13: 15 mg via INTRAVENOUS
  Filled 2019-06-13: qty 1

## 2019-06-13 MED ORDER — FENTANYL CITRATE (PF) 100 MCG/2ML IJ SOLN
INTRAMUSCULAR | Status: AC
  Administered 2019-06-13: 25 ug via INTRAVENOUS
  Filled 2019-06-13: qty 2

## 2019-06-13 MED ORDER — CIPROFLOXACIN HCL 500 MG PO TABS
500.0000 mg | ORAL_TABLET | Freq: Two times a day (BID) | ORAL | 0 refills | Status: AC
Start: 2019-06-13 — End: 2019-06-20

## 2019-06-13 MED ORDER — ONDANSETRON HCL 4 MG/2ML IJ SOLN
4.0000 mg | Freq: Once | INTRAMUSCULAR | Status: AC
  Administered 2019-06-13: 4 mg via INTRAVENOUS
  Filled 2019-06-13: qty 2

## 2019-06-13 MED ORDER — ACETAMINOPHEN 500 MG PO TABS
1000.0000 mg | ORAL_TABLET | Freq: Once | ORAL | Status: AC
  Administered 2019-06-13: 1000 mg via ORAL
  Filled 2019-06-13: qty 2

## 2019-06-13 MED ORDER — ONDANSETRON 4 MG PO TBDP
4.0000 mg | ORAL_TABLET | Freq: Three times a day (TID) | ORAL | 0 refills | Status: DC | PRN
Start: 2019-06-13 — End: 2020-06-05

## 2019-06-13 MED ORDER — LACTATED RINGERS IV BOLUS
500.0000 mL | Freq: Once | INTRAVENOUS | Status: AC
  Administered 2019-06-13: 500 mL via INTRAVENOUS

## 2019-06-13 MED ORDER — HYDROMORPHONE HCL 1 MG/ML IJ SOLN
0.5000 mg | Freq: Once | INTRAMUSCULAR | Status: DC
  Filled 2019-06-13: qty 1

## 2019-06-13 MED ORDER — IBUPROFEN 600 MG PO TABS: 600.0000 mg | ORAL_TABLET | Freq: Three times a day (TID) | ORAL | 0 refills | Status: AC | PRN

## 2019-06-13 MED ORDER — SODIUM CHLORIDE 0.9% FLUSH: 3.0000 mL | Freq: Once | INTRAVENOUS | Status: DC

## 2019-06-13 MED ORDER — SODIUM CHLORIDE 0.9 % IV SOLN
2.0000 g | Freq: Once | INTRAVENOUS | Status: AC
  Administered 2019-06-13: 2 g via INTRAVENOUS
  Filled 2019-06-13: qty 2

## 2019-06-13 MED ORDER — IOHEXOL 300 MG/ML  SOLN
100.0000 mL | Freq: Once | INTRAMUSCULAR | Status: AC | PRN
  Administered 2019-06-13: 100 mL via INTRAVENOUS
  Filled 2019-06-13: qty 100

## 2019-06-13 MED ORDER — FENTANYL CITRATE (PF) 100 MCG/2ML IJ SOLN: 25.0000 ug | Freq: Once | INTRAMUSCULAR | Status: AC

## 2019-06-13 MED ORDER — LACTATED RINGERS IV BOLUS
1000.0000 mL | Freq: Once | INTRAVENOUS | Status: AC
  Administered 2019-06-13: 1000 mL via INTRAVENOUS

## 2019-06-13 NOTE — Consult Note (Signed)
PHARMACY -  BRIEF ANTIBIOTIC NOTE   Pharmacy has received consult(s) for Cefepime from an ED provider.  The patient's profile has been reviewed for ht/wt/allergies/indication/available labs.    One time order(s) placed for Cefepime 2g IV   Further antibiotics/pharmacy consults should be ordered by admitting physician if indicated.                       Thank you, Gardner Candle, PharmD, BCPS Clinical Pharmacist 06/13/2019 6:47 PM

## 2019-06-13 NOTE — ED Notes (Signed)
PT lethargic, will give pain medication once pt able to stay awake, PT given water and snacks per MD and lights turned on to encourage pt to wake up.

## 2019-06-13 NOTE — ED Provider Notes (Signed)
[facility] Emergency Department Provider Note  ____________________________________________   First MD Initiated Contact with Patient 06/13/19 1819     (approximate)  I have reviewed the triage vital signs and the nursing notes.   HISTORY  Chief Complaint Flank Pain    HPI Yolanda Reed is a 44 y.o. female with history bipolar disorder, PTSD, here with severe pain.  The patient states that she woke up this morning with mild lower abdominal pain.  Since then, she has had progressively worsening, severe, bilateral lower abdominal pain.  She describes it as an aching, severe, lower abdominal pain that is worse with palpation.  She is had associated nausea but no vomiting. Pain is constant, worsening. No alleviating factors.       Past Medical History:  Diagnosis Date  . Bipolar disorder (HCC) 09/20/2015  . Gestational diabetes 10/19/1992  . PTSD (post-traumatic stress disorder) 09/20/2015    Patient Active Problem List   Diagnosis Date Noted  . Chronic viral hepatitis C (HCC) 08/12/2018  . Polysubstance abuse (HCC) 04/30/2007  . History of attempted suicide 04/30/2007  . Herpes simplex disease 08/26/1994    Past Surgical History:  Procedure Laterality Date  . APPENDECTOMY    . CESAREAN SECTION      Prior to Admission medications   Medication Sig Start Date End Date Taking? Authorizing Provider  amoxicillin (AMOXIL) 500 MG capsule Take 1 capsule (500 mg total) by mouth 3 (three) times daily. Patient not taking: Reported on 07/28/2018 03/21/18   Enid Derry, PA-C  ciprofloxacin (CIPRO) 500 MG tablet Take 1 tablet (500 mg total) by mouth 2 (two) times daily for 7 days. 06/13/19 06/20/19  Shaune Pollack, MD  ibuprofen (ADVIL) 600 MG tablet Take 1 tablet (600 mg total) by mouth every 8 (eight) hours as needed for up to 7 days for moderate pain. 06/13/19 06/20/19  Shaune Pollack, MD  lidocaine (XYLOCAINE) 2 % solution Use as directed 10 mLs in the  mouth or throat as needed. Patient not taking: Reported on 07/28/2018 03/21/18   Enid Derry, PA-C  metroNIDAZOLE (FLAGYL) 500 MG tablet Take 1 tablet (500 mg total) by mouth 3 (three) times daily for 7 days. 06/13/19 06/20/19  Shaune Pollack, MD  naproxen (NAPROSYN) 500 MG tablet Take 1 tablet (500 mg total) by mouth 2 (two) times daily with a meal. Patient not taking: Reported on 07/28/2018 02/27/17   Tommi Rumps, PA-C  ondansetron (ZOFRAN ODT) 4 MG disintegrating tablet Take 1 tablet (4 mg total) by mouth every 8 (eight) hours as needed for nausea or vomiting. 06/13/19   Shaune Pollack, MD  penicillin v potassium (VEETID) 500 MG tablet Take 1 tablet (500 mg total) by mouth 4 (four) times daily. Patient not taking: Reported on 07/28/2018 07/01/17   Cuthriell, Delorise Royals, PA-C    Allergies Morphine and related  Family History  Problem Relation Age of Onset  . Diabetes Mother   . Heart disease Mother   . Hypertension Brother   . Hyperlipidemia Brother   . Kidney disease Maternal Grandmother   . Diabetes Maternal Grandfather     Social History Social History   Tobacco Use  . Smoking status: Current Every Day Smoker    Packs/day: 0.50    Years: 31.00    Pack years: 15.50    Types: Cigarettes  . Smokeless tobacco: Never Used  Substance Use Topics  . Alcohol use: No  . Drug use: Yes    Types: Marijuana  Review of Systems  Review of Systems  Constitutional: Positive for chills and fatigue. Negative for fever.  HENT: Negative for congestion and sore throat.   Eyes: Negative for visual disturbance.  Respiratory: Negative for cough and shortness of breath.   Cardiovascular: Negative for chest pain.  Gastrointestinal: Positive for abdominal pain and nausea. Negative for diarrhea and vomiting.  Genitourinary: Negative for flank pain.  Musculoskeletal: Negative for back pain and neck pain.  Skin: Negative for rash and wound.  Neurological: Positive for weakness.  All other  systems reviewed and are negative.    ____________________________________________  PHYSICAL EXAM:      VITAL SIGNS: ED Triage Vitals  Enc Vitals Group     BP 06/13/19 1807 102/69     Pulse Rate 06/13/19 1807 89     Resp 06/13/19 1807 20     Temp 06/13/19 1807 99.3 F (37.4 C)     Temp Source 06/13/19 1807 Oral     SpO2 06/13/19 1807 100 %     Weight 06/13/19 1657 139 lb 15.9 oz (63.5 kg)     Height 06/13/19 1657 5\' 4"  (1.626 m)     Head Circumference --      Peak Flow --      Pain Score 06/13/19 1657 8     Pain Loc --      Pain Edu? --      Excl. in [place]? --      Physical Exam Vitals and nursing note reviewed.  Constitutional:      General: She is not in acute distress.    Appearance: She is well-developed.  HENT:     Head: Normocephalic and atraumatic.     Mouth/Throat:     Mouth: Mucous membranes are dry.  Eyes:     Conjunctiva/sclera: Conjunctivae normal.  Cardiovascular:     Rate and Rhythm: Normal rate and regular rhythm.     Heart sounds: Normal heart sounds. No murmur. No friction rub.  Pulmonary:     Effort: Pulmonary effort is normal. No respiratory distress.     Breath sounds: Normal breath sounds. No wheezing or rales.  Abdominal:     General: There is no distension.     Palpations: Abdomen is soft.     Tenderness: There is abdominal tenderness in the right lower quadrant, suprapubic area and left lower quadrant. There is guarding and rebound.  Musculoskeletal:     Cervical back: Neck supple.  Skin:    General: Skin is warm.     Capillary Refill: Capillary refill takes less than 2 seconds.  Neurological:     Mental Status: She is alert and oriented to person, place, and time.     Motor: No abnormal muscle tone.       ____________________________________________   LABS (all labs ordered are listed, but only abnormal results are displayed)  Labs Reviewed  COMPREHENSIVE METABOLIC PANEL - Abnormal; Notable for the following components:       Result Value   Potassium 3.3 (*)    Glucose, Bld 118 (*)    Calcium 8.5 (*)    All other components within normal limits  CBC - Abnormal; Notable for the following components:   WBC 13.0 (*)    All other components within normal limits  URINALYSIS, COMPLETE (UACMP) WITH MICROSCOPIC - Abnormal; Notable for the following components:   Color, Urine YELLOW (*)    APPearance HAZY (*)    Leukocytes,Ua MODERATE (*)    Bacteria, UA RARE (*)  All other components within normal limits  SARS CORONAVIRUS 2 BY RT PCR (HOSPITAL ORDER, PERFORMED IN [place] [facility])  CULTURE, BLOOD (ROUTINE X 2)  CULTURE, BLOOD (ROUTINE X 2)  URINE CULTURE  LIPASE, BLOOD  LACTIC ACID, PLASMA  PREGNANCY, URINE  LACTIC ACID, PLASMA    ____________________________________________  EKG: None ________________________________________  RADIOLOGY All imaging, including plain films, CT scans, and ultrasounds, independently reviewed by me, and interpretations confirmed via formal radiology reads.  ED MD interpretation:   CT A/P: Periportal edema, GB distension, enterocolitis, no renal stones US RUQ: No signs of cholecystitis, no stones  Official radiology report(s): CT ABDOMEN PELVIS W CONTRAST  Result Date: 06/13/2019 CLINICAL DATA:  Left flank pain with nausea and vomiting EXAM: CT ABDOMEN AND PELVIS WITH CONTRAST TECHNIQUE: Multidetector CT imaging of the abdomen and pelvis was performed using the standard protocol following bolus administration of intravenous contrast. CONTRAST:  100mL OMNIPAQUE IOHEXOL 300 MG/ML  SOLN COMPARISON:  January 15, 2013 FINDINGS: Lower chest: There is bibasilar atelectasis. No lung base edema or consolidation evident. Hepatobiliary: There is periportal edema. No focal liver lesions are appreciable. Gallbladder appears borderline dilated without appreciable wall thickening. There is no appreciable biliary duct dilatation. Pancreas: No appendiceal mass or inflammatory focus  evident. Spleen: No splenic lesions evident. Adrenals/Urinary Tract: Right adrenal appears normal. There is an apparent adenoma in the left adrenal measuring 1.0 x 1.0 cm, stable. Kidneys bilaterally show no evident mass or hydronephrosis on either side. No evident renal or ureteral calculus on either side. Urinary bladder is midline with wall thickness within normal limits. Stomach/Bowel: Moderate fluid is seen throughout much of the bowel. There is no appreciable bowel wall or mesenteric thickening. There is no appreciable bowel obstruction. Terminal ileum appears unremarkable. There is no free air or portal venous air. Vascular/Lymphatic: There is no abdominal aortic aneurysm. There are foci of aortic and iliac artery atherosclerosis. No adenopathy is appreciable in the abdomen or pelvis. Reproductive: Uterus is anteverted.  No evident pelvic mass. Other: Appendix absent. No periappendiceal region inflammation. No evident abscess or ascites in the abdomen or pelvis. Musculoskeletal: There are no blastic or lytic bone lesions. There is no intramuscular or abdominal wall lesions. IMPRESSION: 1. There is evidence of periportal edema. Etiology uncertain. Advise appropriate laboratory correlation to assess for potential underlying intrinsic liver disease as a cause. 2. Gallbladder borderline distended. Significance of this finding uncertain. Particularly in light of the changes in the liver, ultrasound of the gallbladder advised to further evaluate. 3. Fluid throughout much of the small and large bowel. Question a degree of enterocolitis. No bowel wall thickening or bowel obstruction. No abscess in the abdomen pelvis. Appendix absent. 4. No renal or ureteral calculus. No hydronephrosis. Urinary bladder wall thickness normal. 5.  Aortic Atherosclerosis (ICD10-I70.0). Electronically Signed   By: Bretta BangWilliam  Woodruff III M.D.   On: 06/13/2019 20:06   US Abdomen Limited RUQ  Result Date: 06/13/2019 CLINICAL DATA:  Pain.   Distended gallbladder. EXAM: ULTRASOUND ABDOMEN LIMITED RIGHT UPPER QUADRANT COMPARISON:  CT from the same day FINDINGS: Gallbladder: No gallstones or wall thickening visualized. No sonographic Murphy sign noted by sonographer. The gallbladder is distended. Common bile duct: Diameter: 5 mm Liver: No focal lesion identified. Within normal limits in parenchymal echogenicity. Portal vein is patent on color Doppler imaging with normal direction of blood flow towards the liver. Other: None. IMPRESSION: Distended gallbladder without evidence for cholelithiasis or acute cholecystitis. Electronically Signed   By: Beryle Quanthristopher  Green M.D.  On: 06/13/2019 20:58    ____________________________________________  PROCEDURES   Procedure(s) performed (including Critical Care):  .Critical Care Performed by: Shaune Pollack, MD Authorized by: Shaune Pollack, MD   Critical care provider statement:    Critical care time (minutes):  35   Critical care time was exclusive of:  Separately billable procedures and treating other patients and teaching time   Critical care was necessary to treat or prevent imminent or life-threatening deterioration of the following conditions:  Cardiac failure, circulatory failure and sepsis   Critical care was time spent personally by me on the following activities:  Development of treatment plan with patient or surrogate, discussions with consultants, evaluation of patient's response to treatment, examination of patient, obtaining history from patient or surrogate, ordering and performing treatments and interventions, ordering and review of laboratory studies, ordering and review of radiographic studies, pulse oximetry, re-evaluation of patient's condition and review of old charts   I assumed direction of critical care for this patient from another provider in my specialty: no      ____________________________________________  INITIAL IMPRESSION / MDM / ASSESSMENT AND PLAN / ED  COURSE  As part of my medical decision making, I reviewed the following data within the electronic MEDICAL RECORD NUMBER Nursing notes reviewed and incorporated, Old chart reviewed, Notes from prior ED visits, and [place] Controlled Substance Database       *Latia L Fager was evaluated in Emergency Department on 06/13/2019 for the symptoms described in the history of present illness. She was evaluated in the context of the global COVID-19 pandemic, which necessitated consideration that the patient might be at risk for infection with the SARS-CoV-2 virus that causes COVID-19. Institutional protocols and algorithms that pertain to the evaluation of patients at risk for COVID-19 are in a state of rapid change based on information released by regulatory bodies including the CDC and federal and state organizations. These policies and algorithms were followed during the patient's care in the ED.  Some ED evaluations and interventions may be delayed as a result of limited staffing during the pandemic.*  Clinical Course as of Jun 12 2328  Mon Jun 13, 2019  1909 44 yo F here with sepsis likely 2/2 UTI vs intra-abd source, consideration of PID, pelvic abscess. Will await better sx control prior to pelvic. CT ordered for evaluation given h/o cervical cancer/mass, DDx includes diverticulitis. S/p appendectomy. Broad-spectrum ABX, IVF started.   [CI]  1909 Code sepsis initiated.   [CI]  1929 Leukocytosis is likely reactive. UA with possible pyuria though no urinary sx so hesitant to attribute fever to this. CT is pending.   [CI]  2029 CT shows periportal edema and possible GB thickening/edema - suspect this is related to her viral hepatitis/hep C but given her fever and per recommendations on CT, will check U/S. Otherwise, findings are c/w enterocolitis. No recent ABX use or diarrhea.   [CI]  2155 US shows no signs of cholecystitis. Pt feels much better in ED. Given her well appearance, normal LA and VS, and improvement,  with CT findings showing enterocolitis but no complications, will PO challenge. She would like to attempt outpt management which I think is reasonable. Given her UA findings and fever, will cover with ABX empirically. Given that she is not having diarrhea, unable to send GI Panel and unlikely C. Diff.   [CI]    Clinical Course User Index [CI] Shaune Pollack, MD    Medical Decision Making:  As above. 44 yo F here with  fever, abd pain. Likely 2/2 enterocolitis vs UTI. No surgical abnormality on CT. Hepatic and GB congestion likely 2/2 known hepatitis. She has normal LA, reassuring labs and is well appearing after fluids, ABX. Tolerating PO. Denies IVDU (h/o meth, cocaine use but no IV use). Will d/c on abx.  ____________________________________________  FINAL CLINICAL IMPRESSION(S) / ED DIAGNOSES  Final diagnoses:  RUQ pain  Enterocolitis  Fever in adult     MEDICATIONS GIVEN DURING THIS VISIT:  Medications  sodium chloride flush (NS) 0.9 % injection 3 mL (3 mLs Intravenous Not Given 06/13/19 1913)  sodium chloride 0.9 % bolus 1,000 mL (0 mLs Intravenous Stopped 06/13/19 2012)  ondansetron (ZOFRAN) injection 4 mg (4 mg Intravenous Given 06/13/19 1927)  ketorolac (TORADOL) 30 MG/ML injection 15 mg (15 mg Intravenous Given 06/13/19 1930)  HYDROmorphone (DILAUDID) injection 0.5 mg (0.5 mg Intravenous Given 06/13/19 1916)  ceFEPIme (MAXIPIME) 2 g in sodium chloride 0.9 % 100 mL IVPB (0 g Intravenous Stopped 06/13/19 2011)  metroNIDAZOLE (FLAGYL) IVPB 500 mg (0 mg Intravenous Stopped 06/13/19 2011)  lactated ringers bolus 1,000 mL (0 mLs Intravenous Stopped 06/13/19 2012)  lactated ringers bolus 500 mL (0 mLs Intravenous Stopped 06/13/19 2012)  acetaminophen (TYLENOL) tablet 1,000 mg (1,000 mg Oral Given 06/13/19 1913)  iohexol (OMNIPAQUE) 300 MG/ML solution 100 mL (100 mLs Intravenous Contrast Given 06/13/19 1948)  fentaNYL (SUBLIMAZE) injection 25 mcg (25 mcg Intravenous Given 06/13/19 2156)     ED  Discharge Orders         Ordered    ciprofloxacin (CIPRO) 500 MG tablet  2 times daily     06/13/19 2149    metroNIDAZOLE (FLAGYL) 500 MG tablet  3 times daily     06/13/19 2149    ibuprofen (ADVIL) 600 MG tablet  Every 8 hours PRN     06/13/19 2149    ondansetron (ZOFRAN ODT) 4 MG disintegrating tablet  Every 8 hours PRN     06/13/19 2149           Note:  This document was prepared using Dragon voice recognition software and may include unintentional dictation errors.   Shaune Pollack, MD 06/13/19 [phone]

## 2019-06-13 NOTE — Progress Notes (Signed)
CODE SEPSIS - PHARMACY COMMUNICATION  **Broad Spectrum Antibiotics should be administered within 1 hour of Sepsis diagnosis**  Time Code Sepsis Called/Page Received: @ 1845  Antibiotics Ordered: cefepime and metronidazole   Time of 1st antibiotic administration: @ 1922  Additional action taken by pharmacy: Spoke with RN @ [phone] about time left to administer antibiotics for code sepsis   Gardner Candle, PharmD, BCPS Clinical Pharmacist 06/13/2019 6:49 PM

## 2019-06-13 NOTE — ED Triage Notes (Signed)
C/O left flank pain onset this morning.  Also c/o nausea.  Denies dysuria

## 2019-06-14 LAB — URINE CULTURE: Culture: <10000 — AB

## 2019-06-14 NOTE — ED Notes (Signed)
While attempting to d/c pt, this RN let pt use phone and dialed numbers for her to find a ride. PT unable to find ride, states that her ride took sleeping medicine already. Informed charge RN who states no cab voucher at this time. Informed pt that she could keep trying for ride out in lobby and pt started sobbing loudly stating she didn't want to go out there. Apologized to pt but insisted she could not wait in room as the area was closing. PT started cussing and yelling at this RN. PT provided her blanket in her wheelchair and  wheeled outside with per her request so that she could smoke.

## 2019-06-14 NOTE — ED Notes (Signed)
PT ambulatory to restroom

## 2019-06-18 LAB — CULTURE, BLOOD (ROUTINE X 2)
Culture: NO GROWTH
Culture: NO GROWTH
Special Requests: ADEQUATE
Special Requests: ADEQUATE

## 2019-07-21 ENCOUNTER — Encounter: Payer: Self-pay | Admitting: Physician Assistant

## 2019-07-21 ENCOUNTER — Other Ambulatory Visit: Payer: Self-pay

## 2019-07-21 ENCOUNTER — Ambulatory Visit (LOCAL_COMMUNITY_HEALTH_CENTER): Payer: Self-pay | Admitting: Physician Assistant

## 2019-07-21 VITALS — BP 105/70 | Ht 63.0 in | Wt 144.6 lb

## 2019-07-21 DIAGNOSIS — Z3009 Encounter for other general counseling and advice on contraception: Secondary | ICD-10-CM

## 2019-07-21 DIAGNOSIS — A5901 Trichomonal vulvovaginitis: Secondary | ICD-10-CM

## 2019-07-21 DIAGNOSIS — B009 Herpesviral infection, unspecified: Secondary | ICD-10-CM

## 2019-07-21 DIAGNOSIS — Z720 Tobacco use: Secondary | ICD-10-CM

## 2019-07-21 DIAGNOSIS — B182 Chronic viral hepatitis C: Secondary | ICD-10-CM

## 2019-07-21 LAB — WET PREP FOR TRICH, YEAST, CLUE
Trichomonas Exam: POSITIVE — AB
Yeast Exam: NEGATIVE

## 2019-07-21 MED ORDER — ACYCLOVIR 400 MG PO TABS: 400.0000 mg | ORAL_TABLET | Freq: Three times a day (TID) | ORAL | 0 refills | Status: AC

## 2019-07-21 MED ORDER — MULTIVITAMINS PO CAPS: 1.0000 | ORAL_CAPSULE | Freq: Every day | ORAL | 0 refills | Status: AC

## 2019-07-21 MED ORDER — METRONIDAZOLE 500 MG PO TABS: 2000.0000 mg | ORAL_TABLET | Freq: Once | ORAL | 0 refills | Status: AC

## 2019-07-21 NOTE — Progress Notes (Signed)
Family Planning Visit- Initial Visit  Subjective:  Yolanda Reed is a 44 y.o.  No obstetric history on file.   being seen today for an initial well woman visit and to discuss family planning options.  She is currently using abstinence for pregnancy prevention. Patient reports she does not want a pregnancy in the next year.  Patient has the following medical conditions has Herpes simplex disease; Polysubstance abuse (HCC); History of attempted suicide; Chronic viral hepatitis C (HCC); and Tobacco abuse on their problem list.  Chief Complaint  Patient presents with   Gynecologic Exam    Annual PE and Pap Smear    Patient reports she lost the Hep C info we mailed to her after her pos test last year, requests new info.   Body mass index is 25.61 kg/m. - Patient is eligible for diabetes screening based on BMI and age >71?  no HA1C ordered? not applicable  Patient reports 1 of partners in last year. Desires STI screening?  Yes  Has patient been screened once for HCV in the past?  Yes  No results found for: HCVAB  Does the patient have current of drug use, have a partner with drug use, and/or has been incarcerated since last result? No  If yes-- Screen for HCV through [facility]   Does the patient meet criteria for HBV testing? Yes  Criteria:  -Household, sexual or needle sharing contact with HBV -History of drug use -HIV positive -Those with known Hep C   Health Maintenance Due  Topic Date Due   COVID-19 Vaccine (1) Never done   TETANUS/TDAP  Never done    Review of Systems  Constitutional: Negative.   HENT: Negative.   Eyes: Negative.   Respiratory: Negative.   Cardiovascular: Negative.   Gastrointestinal: Negative.   Genitourinary:       States she has painful "warts" at lower entrance to vagina for several days.  Musculoskeletal: Negative.   Skin: Negative.   Neurological: Negative.   Endo/Heme/Allergies: Negative.   Psychiatric/Behavioral: Negative.      The following portions of the patient's history were reviewed and updated as appropriate: allergies, current medications, past family history, past medical history, past social history, past surgical history and problem list. Problem list updated.   See flowsheet for other program required questions.  Objective:   Vitals:   07/21/19 1100  BP: 105/70  Weight: 144 lb 9.6 oz (65.6 kg)  Height: 5\' 3"  (1.6 m)    Physical Exam Constitutional:      Appearance: Normal appearance.  HENT:     Head: Normocephalic and atraumatic.  Cardiovascular:     Rate and Rhythm: Normal rate and regular rhythm.     Pulses: Normal pulses.     Heart sounds: Normal heart sounds.  Pulmonary:     Effort: Pulmonary effort is normal.  Abdominal:     General: Bowel sounds are normal.     Palpations: Abdomen is soft.  Genitourinary:    Exam position: Lithotomy position.     Labia:        Right: Lesion present. No tenderness.        Left: Lesion present.      Urethra: No urethral pain, urethral swelling or urethral lesion.     Vagina: Vaginal discharge present. No tenderness or bleeding.     Cervix: No cervical motion tenderness, friability, lesion or cervical bleeding.     Uterus: Normal.      Adnexa: Right adnexa normal  and left adnexa normal.     Rectum: No external hemorrhoid.       Comments: Vag pH <4.5 Musculoskeletal:        General: Normal range of motion.     Cervical back: Normal range of motion.  Lymphadenopathy:     Cervical: No cervical adenopathy.     Lower Body: No right inguinal adenopathy. No left inguinal adenopathy.  Skin:    General: Skin is warm and dry.     Comments: Tattoos noted  Neurological:     General: No focal deficit present.     Mental Status: She is alert.  Psychiatric:        Behavior: Behavior normal.        Thought Content: Thought content normal.        Judgment: Judgment normal.       Assessment and Plan:  Yolanda Reed is a 44 y.o. female  presenting to the [facility] Department for an initial well woman exam/family planning visit  Contraception counseling: Reviewed all forms of birth control options in the tiered based approach. available including abstinence; over the counter/barrier methods; hormonal contraceptive medication including pill, patch, ring, injection,contraceptive implant, ECP; hormonal and nonhormonal IUDs; permanent sterilization options including vasectomy and the various tubal sterilization modalities. Risks, benefits, and typical effectiveness rates were reviewead.  Questions were answered.  Written information was also given to the patient to review.  Patient desires abstinence. She will follow up in  1 year for surveillance.  She was told to call with any further questions, or with any concerns about this method of contraception.  Emphasized use of condoms 100% of the time for STI prevention.   1. Family planning services Pt declines contraceptive method - plans abstinence for now. - IGP, [facility] - HIV [place] [facility] - Hep B [place] [facility] - Syphilis Serology, [place] [facility] - Chlamydia/Gonorrhea [place] [facility] - WET PREP FOR TRICH, YEAST, CLUE  2. Herpes simplex disease Current outbreak. Acyclovir 400mg  1 po tid for 10 days - please give  3. Chronic hepatitis C without hepatic coma (HCC) Provide referral/resource and control measures info.  4. Tobacco abuse Counseled smoking via 5 As - pt considering, but not ready.   5. Trichomonas Treat per s.o.    Return for annual well-woman exam.  No future appointments.  , PA-C

## 2019-07-21 NOTE — Progress Notes (Signed)
Patient here today for PE and Pap Smear. Last PE and Pap Smear here was 09/10/2015. Is not interested in birth control at this time. Does want all STD screening. Tawny Hopping, RN

## 2019-07-21 NOTE — Progress Notes (Addendum)
Acyclovir dispensed per provider orders. [employer] results reviewed. Patient treated for Trich per standing orders. MVI's accepted. Tawny Hopping, RN

## 2019-07-27 ENCOUNTER — Encounter: Payer: Self-pay | Admitting: Family Medicine

## 2019-07-27 ENCOUNTER — Telehealth: Payer: Self-pay | Admitting: Family Medicine

## 2019-07-27 LAB — IGP, APTIMA HPV
HPV Aptima: NEGATIVE
PAP Smear Comment: 0

## 2019-07-27 LAB — HEPATITIS B SURFACE ANTIGEN: Hepatitis B Surface Ag: NONREACTIVE

## 2019-07-27 NOTE — Telephone Encounter (Signed)
Call to patient to discuss positive TR.  Patient verified by password.  Patient informed of +syphilis result.  Patient schedueld for treatment appointment 07/28/19 @ 1 pm.  Patient verbalizes understanding at this time and has no further questions. Wendi Snipes, RN

## 2019-07-28 ENCOUNTER — Ambulatory Visit: Payer: Self-pay | Admitting: Physician Assistant

## 2019-07-28 ENCOUNTER — Other Ambulatory Visit: Payer: Self-pay

## 2019-07-28 DIAGNOSIS — A539 Syphilis, unspecified: Secondary | ICD-10-CM

## 2019-07-28 DIAGNOSIS — Z113 Encounter for screening for infections with a predominantly sexual mode of transmission: Secondary | ICD-10-CM

## 2019-07-28 MED ORDER — PENICILLIN G BENZATHINE 1200000 UNIT/2ML IM SUSP
2.4000 10*6.[IU] | Freq: Once | INTRAMUSCULAR | Status: AC
  Administered 2019-07-28: 2.4 10*6.[IU] via INTRAMUSCULAR

## 2019-07-28 NOTE — Progress Notes (Signed)
Pt received Bicillin 2.77mu IM per provider order and pt tolerated well. Counseled pt per provider orders and pt states understanding. Pt scheduled to RTC in 1 week on 08/04/2019 at 11:00am per pt request for possible tx #2 depending on findings and TR. Pt aware of appt date and time and to arrive early for check-in, and appt card given to pt. Provider orders completed.

## 2019-07-30 DIAGNOSIS — A539 Syphilis, unspecified: Secondary | ICD-10-CM | POA: Insufficient documentation

## 2019-07-30 NOTE — Progress Notes (Signed)
S:  Patient into clinic for follow up.  States that she was called and told to RTC for treatment today.  Patient seen at [facility] on 07/21/2019 and tested for STDs.  Patient denies any symptoms such as lesions, rash, hair loss, sore throat and vision changes.  Reviewed with patient information from previous visit and patient denies any changes. O:  WDWN female in NAD, A&O x 3; RPR from 07/21/2019 visit = reactive 1:16, TREP-Sure reactive.  Previous RPR at [facility] 07/28/2018, was non-reactive. A/P:  1.  Patient needs treatment for Syphilis. 2.  Treat with Bicilliin 2.4 mu IM today. 3.  No sex for 14 days and until after partner completes treatment. 4.  Rec condoms with all sex. 5.  Await results from RPR re-drawn today.  Likely will not need further treatment. 6.  RTC in 6 months, 12 months and annually to monitor titer. 7.  Call with questions or concerns.

## 2019-08-04 ENCOUNTER — Ambulatory Visit: Payer: Self-pay

## 2019-08-04 ENCOUNTER — Other Ambulatory Visit: Payer: Self-pay

## 2019-08-04 ENCOUNTER — Ambulatory Visit: Payer: Self-pay | Admitting: Family Medicine

## 2019-08-04 DIAGNOSIS — Z113 Encounter for screening for infections with a predominantly sexual mode of transmission: Secondary | ICD-10-CM

## 2019-08-04 NOTE — Progress Notes (Signed)
S: Client here today to find out if she needs another PCN injection for treatment of syphilis.  She had her first PCN injection on 07/28/2019 O: DIS contacted by Wendi Snipes RN.  Recommendation is she needs 1  injection d/t her titer on 07/28/2018 was non-reactive. A: Syphilis f/u P;  No 2nd  treatment indicated. Co to always use condoms for STD prevention and to return for RPR 6 and 12 months post-treatment.

## 2020-05-25 ENCOUNTER — Emergency Department
Admission: EM | Admit: 2020-05-25 | Discharge: 2020-05-25 | Disposition: A | Discharge status: Home / Self Care | Payer: Self-pay | Attending: Emergency Medicine | Admitting: Emergency Medicine

## 2020-05-25 DIAGNOSIS — K0381 Cracked tooth: Secondary | ICD-10-CM | POA: Insufficient documentation

## 2020-05-25 DIAGNOSIS — K047 Periapical abscess without sinus: Secondary | ICD-10-CM | POA: Insufficient documentation

## 2020-05-25 DIAGNOSIS — F17210 Nicotine dependence, cigarettes, uncomplicated: Secondary | ICD-10-CM | POA: Insufficient documentation

## 2020-05-25 DIAGNOSIS — S025XXA Fracture of tooth (traumatic), initial encounter for closed fracture: Secondary | ICD-10-CM

## 2020-05-25 HISTORY — DX: Unspecified viral hepatitis C without hepatic coma: B19.20

## 2020-05-25 MED ORDER — HYDROCODONE-ACETAMINOPHEN 5-325 MG PO TABS: 1.0000 | ORAL_TABLET | Freq: Four times a day (QID) | ORAL | 0 refills | Status: DC | PRN

## 2020-05-25 MED ORDER — HYDROCODONE-ACETAMINOPHEN 5-325 MG PO TABS
1.0000 | ORAL_TABLET | ORAL | Status: AC
  Administered 2020-05-25: 1 via ORAL
  Filled 2020-05-25: qty 1

## 2020-05-25 MED ORDER — AMOXICILLIN-POT CLAVULANATE 875-125 MG PO TABS
1.0000 | ORAL_TABLET | Freq: Once | ORAL | Status: AC
  Administered 2020-05-25: 1 via ORAL
  Filled 2020-05-25: qty 1

## 2020-05-25 MED ORDER — AMOXICILLIN-POT CLAVULANATE 875-125 MG PO TABS
1.0000 | ORAL_TABLET | Freq: Two times a day (BID) | ORAL | 0 refills | Status: AC
Start: 2020-05-25 — End: 2020-06-01

## 2020-05-25 NOTE — ED Provider Notes (Signed)
[facility] EMERGENCY DEPARTMENT Provider Note   CSN: [id number] Arrival date & time: 05/25/20  1913     History Chief Complaint  Patient presents with  . Dental Pain    Yolanda Reed is a 45 y.o. female presents the emergency department for evaluation of dental pain.  Her right back upper molar, tooth #1 was cracked a couple months ago.  She has had some decay and recently over the last 2 days has had pain and swelling to the area.  She has tried Aleve with no improvement.  She denies any fevers or difficulty swallowing but has severe pain with trying to eat and drink.  HPI     Past Medical History:  Diagnosis Date  . Bipolar disorder (HCC) 09/20/2015  . Gestational diabetes 10/19/1992  . Hepatitis C   . PTSD (post-traumatic stress disorder) 09/20/2015    Patient Active Problem List   Diagnosis Date Noted  . Syphilis 07/30/2019  . Tobacco abuse 07/21/2019  . Chronic viral hepatitis C (HCC) 08/12/2018  . Polysubstance abuse (HCC) 04/30/2007  . History of attempted suicide 04/30/2007  . Herpes simplex disease 08/26/1994    Past Surgical History:  Procedure Laterality Date  . APPENDECTOMY    . CESAREAN SECTION  1996     OB History    Gravida  3   Para  3   Term  3   Preterm  0   AB  0   Living  3     SAB  0   IAB  0   Ectopic  0   Multiple  0   Live Births  3           Family History  Problem Relation Age of Onset  . Diabetes Mother   . Heart disease Mother   . Hypertension Brother   . Hyperlipidemia Brother   . Kidney disease Maternal Grandmother   . Diabetes Maternal Grandfather   . Hypertension Paternal Grandmother     Social History   Tobacco Use  . Smoking status: Current Every Day Smoker    Packs/day: 0.50    Years: 31.00    Pack years: 15.50    Types: Cigarettes  . Smokeless tobacco: Never Used  Substance Use Topics  . Alcohol use: No  . Drug use: Yes    Types: Marijuana    Home  Medications Prior to Admission medications   Medication Sig Start Date End Date Taking? Authorizing Provider  amoxicillin-clavulanate (AUGMENTIN) 875-125 MG tablet Take 1 tablet by mouth every 12 (twelve) hours for 7 days. 05/25/20 06/01/20 Yes Evon Slack, PA-C  HYDROcodone-acetaminophen (NORCO) 5-325 MG tablet Take 1 tablet by mouth every 6 (six) hours as needed for moderate pain. 05/25/20  Yes Evon Slack, PA-C  amoxicillin (AMOXIL) 500 MG capsule Take 1 capsule (500 mg total) by mouth 3 (three) times daily. Patient not taking: Reported on 07/28/2018 03/21/18   Enid Derry, PA-C  lidocaine (XYLOCAINE) 2 % solution Use as directed 10 mLs in the mouth or throat as needed. Patient not taking: Reported on 07/28/2018 03/21/18   Enid Derry, PA-C  naproxen (NAPROSYN) 500 MG tablet Take 1 tablet (500 mg total) by mouth 2 (two) times daily with a meal. Patient not taking: Reported on 07/28/2018 02/27/17   Tommi Rumps, PA-C  ondansetron (ZOFRAN ODT) 4 MG disintegrating tablet Take 1 tablet (4 mg total) by mouth every 8 (eight) hours as needed for nausea or vomiting. Patient not  taking: Reported on 07/21/2019 06/13/19   Shaune Pollack, MD  penicillin v potassium (VEETID) 500 MG tablet Take 1 tablet (500 mg total) by mouth 4 (four) times daily. Patient not taking: Reported on 07/28/2018 07/01/17   Cuthriell, Delorise Royals, PA-C    Allergies    Morphine and related  Review of Systems   Review of Systems  Constitutional: Negative.  Negative for chills and fever.  HENT: Positive for dental problem. Negative for drooling, facial swelling, mouth sores, trouble swallowing and voice change.   Respiratory: Negative for shortness of breath.   Cardiovascular: Negative for chest pain.  Gastrointestinal: Negative for nausea and vomiting.  Musculoskeletal: Negative for arthralgias, neck pain and neck stiffness.  Skin: Negative.   Psychiatric/Behavioral: Negative for confusion.  All other systems  reviewed and are negative.   Physical Exam Updated Vital Signs BP (!) 157/93 (BP Location: Left Arm)   Pulse 85   Temp 99.3 F (37.4 C) (Oral)   Resp 20   Ht 5\' 4"  (1.626 m)   Wt 68.4 kg   LMP 05/07/2020 (Approximate)   SpO2 99%   BMI 25.88 kg/m   Physical Exam Constitutional:      Appearance: She is well-developed.  HENT:     Head: Normocephalic and atraumatic.     Comments: No pharyngeal erythema, swelling or exudates.  Uvula is midline.  No intraoral soft tissue swelling.  Tooth #1 shows significant destruction, cracked, decay with no significant fluctuance or soft tissue swelling. Eyes:     Conjunctiva/sclera: Conjunctivae normal.  Cardiovascular:     Rate and Rhythm: Normal rate.  Pulmonary:     Effort: Pulmonary effort is normal. No respiratory distress.  Musculoskeletal:        General: Normal range of motion.     Cervical back: Normal range of motion.  Skin:    General: Skin is warm.     Findings: No rash.  Neurological:     Mental Status: She is alert and oriented to person, place, and time.  Psychiatric:        Behavior: Behavior normal.        Thought Content: Thought content normal.     ED Results / Procedures / Treatments   Labs (all labs ordered are listed, but only abnormal results are displayed) Labs Reviewed - No data to display  EKG None  Radiology No results found.  Procedures Procedures   Medications Ordered in ED Medications  HYDROcodone-acetaminophen (NORCO/VICODIN) 5-325 MG per tablet 1 tablet (has no administration in time range)  amoxicillin-clavulanate (AUGMENTIN) 875-125 MG per tablet 1 tablet (has no administration in time range)    ED Course  I have reviewed the triage vital signs and the nursing notes.  Pertinent labs & imaging results that were available during my care of the patient were reviewed by me and considered in my medical decision making (see chart for details).    MDM Rules/Calculators/A&P                           45 year old female with broken tooth with infection.  She has mild soft tissue swelling, low-grade fever.  Tolerating p.o. well.  No relief with Aleve.  Will start on Augmentin and she is given a prescription for hydrocodone.  She understands signs symptoms return to the ER for.  She will follow-up with dental clinic. Final Clinical Impression(s) / ED Diagnoses Final diagnoses:  Closed fracture of tooth, initial encounter  Dental  infection    Rx / DC Orders ED Discharge Orders         Ordered    amoxicillin-clavulanate (AUGMENTIN) 875-125 MG tablet  Every 12 hours        05/25/20 2006    HYDROcodone-acetaminophen (NORCO) 5-325 MG tablet  Every 6 hours PRN        05/25/20 2006           Ronnette Juniper 05/25/20 2012    Phineas Semen, MD 05/25/20 2154

## 2020-05-25 NOTE — Discharge Instructions (Addendum)
Please take medications as prescribed.  Return to the ER for any fevers, worsening pain or swelling or any urgent changes in health.  Please call dental clinic Monday morning to schedule follow-up.

## 2020-05-25 NOTE — ED Triage Notes (Signed)
Pt reports right sided upper wisdom tooth pain x2 days, states "it's been broken for awhile but I was eating a cracker and it stabbed it so now it's swollen up." Last took aleve 4 hours PTA.

## 2020-06-05 ENCOUNTER — Other Ambulatory Visit: Payer: Self-pay

## 2020-06-05 ENCOUNTER — Emergency Department
Admission: EM | Admit: 2020-06-05 | Discharge: 2020-06-05 | Disposition: A | Discharge status: Home / Self Care | Payer: Self-pay | Attending: Emergency Medicine | Admitting: Emergency Medicine

## 2020-06-05 DIAGNOSIS — J069 Acute upper respiratory infection, unspecified: Secondary | ICD-10-CM | POA: Insufficient documentation

## 2020-06-05 DIAGNOSIS — R42 Dizziness and giddiness: Secondary | ICD-10-CM | POA: Insufficient documentation

## 2020-06-05 DIAGNOSIS — U071 COVID-19: Secondary | ICD-10-CM | POA: Insufficient documentation

## 2020-06-05 DIAGNOSIS — F17210 Nicotine dependence, cigarettes, uncomplicated: Secondary | ICD-10-CM | POA: Insufficient documentation

## 2020-06-05 LAB — RESP PANEL BY RT-PCR (FLU A&B, COVID) ARPGX2
Influenza A by PCR: NEGATIVE
Influenza B by PCR: NEGATIVE
SARS Coronavirus 2 by RT PCR: POSITIVE — AB

## 2020-06-05 NOTE — Discharge Instructions (Addendum)
Please take Tylenol or ibuprofen as needed for chills and body aches. Your flu and Covid test will be back in 2 hours and I will call you. Follow with primary care or return to ER with any worsening or changes in symptoms.

## 2020-06-05 NOTE — ED Triage Notes (Signed)
Pt c/o cough with SOB, chills , body aches , just not feeling well today

## 2020-06-05 NOTE — ED Provider Notes (Signed)
[facility] Emergency Department Provider Note  ____________________________________________   Event Date/Time   First MD Initiated Contact with Patient 06/05/20 1754     (approximate)  I have reviewed the triage vital signs and the nursing notes.   HISTORY  Chief Complaint URI  HPI Rhilynn L Casserly is a 45 y.o. female who presents to the emergency department for evaluation of cough with occasional intermittent shortness of breath, chills, body aches.  She states she has just not been feeling well today.  She reports that a few days ago symptoms started with an episode of lightheadedness, however these more significant chills and cough began today.  She denies any chest pain, syncope or presyncope, abdominal pain, back pain or dysuria.  She denies known fevers at home.         Past Medical History:  Diagnosis Date  . Bipolar disorder (HCC) 09/20/2015  . Gestational diabetes 10/19/1992  . Hepatitis C   . PTSD (post-traumatic stress disorder) 09/20/2015    Patient Active Problem List   Diagnosis Date Noted  . Syphilis 07/30/2019  . Tobacco abuse 07/21/2019  . Chronic viral hepatitis C (HCC) 08/12/2018  . Polysubstance abuse (HCC) 04/30/2007  . History of attempted suicide 04/30/2007  . Herpes simplex disease 08/26/1994    Past Surgical History:  Procedure Laterality Date  . APPENDECTOMY    . CESAREAN SECTION  1996    Prior to Admission medications   Medication Sig Start Date End Date Taking? Authorizing Provider  HYDROcodone-acetaminophen (NORCO) 5-325 MG tablet Take 1 tablet by mouth every 6 (six) hours as needed for moderate pain. 05/25/20   Evon Slack, PA-C    Allergies Morphine and related  Family History  Problem Relation Age of Onset  . Diabetes Mother   . Heart disease Mother   . Hypertension Brother   . Hyperlipidemia Brother   . Kidney disease Maternal Grandmother   . Diabetes Maternal Grandfather   . Hypertension  Paternal Grandmother     Social History Social History   Tobacco Use  . Smoking status: Current Every Day Smoker    Packs/day: 0.50    Years: 31.00    Pack years: 15.50    Types: Cigarettes  . Smokeless tobacco: Never Used  Substance Use Topics  . Alcohol use: No  . Drug use: Yes    Types: Marijuana    Review of Systems Constitutional: +chills, -chills Eyes: No visual changes. ENT: No sore throat. Cardiovascular: Denies chest pain. Respiratory: + Cough,+ intermittent shortness of breath. Gastrointestinal: No abdominal pain.  No nausea, no vomiting.  No diarrhea.  No constipation. Genitourinary: Negative for dysuria. Musculoskeletal: Negative for back pain. Skin: Negative for rash. Neurological: Negative for headaches, focal weakness or numbness.  ____________________________________________   PHYSICAL EXAM:  VITAL SIGNS: ED Triage Vitals  Enc Vitals Group     BP 06/05/20 1721 121/74     Pulse Rate 06/05/20 1721 93     Resp 06/05/20 1721 17     Temp 06/05/20 1721 98.7 F (37.1 C)     Temp Source 06/05/20 1721 Oral     SpO2 06/05/20 1721 99 %     Weight 06/05/20 1722 150 lb (68 kg)     Height 06/05/20 1722 5\' 4"  (1.626 m)     Head Circumference --      Peak Flow --      Pain Score 06/05/20 1722 0     Pain Loc --  Pain Edu? --      Excl. in GC? --    Constitutional: Alert and oriented. Well appearing and in no acute distress. Eyes: Conjunctivae are normal. PERRL. EOMI. Head: Atraumatic. Nose: No congestion/rhinnorhea. Mouth/Throat: Mucous membranes are moist.  Oropharynx mildly erythematous with no tonsillar enlargement or exudate Neck: No stridor.   Lymphatic: No cervical lymphadenopathy Cardiovascular: Normal rate, regular rhythm. Grossly normal heart sounds.  Good peripheral circulation. Respiratory: Normal respiratory effort.  No retractions. Lungs CTAB. Gastrointestinal: Soft and nontender. No distention. No abdominal bruits. No CVA  tenderness. Musculoskeletal: No lower extremity tenderness nor edema.  No joint effusions. Neurologic:  Normal speech and language. No gross focal neurologic deficits are appreciated. No gait instability. Skin:  Skin is warm, dry and intact. No rash noted. Psychiatric: Mood and affect are normal. Speech and behavior are normal.  ____________________________________________   LABS (all labs ordered are listed, but only abnormal results are displayed)  Labs Reviewed  RESP PANEL BY RT-PCR (FLU A&B, COVID) ARPGX2 - Abnormal; Notable for the following components:      Result Value   SARS Coronavirus 2 by RT PCR POSITIVE (*)    All other components within normal limits    ____________________________________________   INITIAL IMPRESSION / ASSESSMENT AND PLAN / ED COURSE  As part of my medical decision making, I reviewed the following data within the electronic MEDICAL RECORD NUMBER Nursing notes reviewed and incorporated and Notes from prior ED visits        Patient is a 45 year old female who presents to the emergency department for evaluation of acute illness with significant symptom worsening today.  See HPI for further details.  In triage, patient has normal vital signs.  Physical exam, she does have a mildly erythematous oropharynx, otherwise exam is grossly within normal limits.  Patient is PERC negative, low suspicion for PE as the cause of her perceived shortness of breath.  She has had normal vital signs during her visit with [place].  At this time, suspect viral URI as cause of her symptoms.  Will obtain flu and COVID testing and call the patient with the results.  Discussed symptomatic treatment and patient is amenable with plan.  Return precautions were discussed and she stable this time for outpatient management.      ____________________________________________   FINAL CLINICAL IMPRESSION(S) / ED DIAGNOSES  Final diagnoses:  Viral URI     ED Discharge Orders    None        Note:  This document was prepared using Dragon voice recognition software and may include unintentional dictation errors.   Jonnie, Kubly, PA 06/05/20 2213    Delton Prairie, MD 06/06/20 1045

## 2020-06-06 ENCOUNTER — Telehealth: Payer: Self-pay | Admitting: Oncology

## 2020-06-06 ENCOUNTER — Encounter: Payer: Self-pay | Admitting: Oncology

## 2020-06-06 NOTE — Telephone Encounter (Signed)
Called to discuss with patient about COVID-19 symptoms and the use of one of the available treatments for those with mild to moderate Covid symptoms and at a high risk of hospitalization.  Pt appears to qualify for outpatient treatment due to co-morbid conditions and/or a member of an at-risk group in accordance with the FDA Emergency Use Authorization.    Symptom onset: A few days ago?? Vaccinated: No  Booster? No  Immunocompromised? No  Qualifiers:  Past Medical History:  Diagnosis Date  . Bipolar disorder (HCC) 09/20/2015  . Gestational diabetes 10/19/1992  . Hepatitis C   . PTSD (post-traumatic stress disorder) 09/20/2015  current everyday smoker  NIH Criteria: Tier 2  Unable to reach pt - No VM available- Sent [facility]   Mauro Kaufmann

## 2020-06-26 ENCOUNTER — Emergency Department
Admission: EM | Admit: 2020-06-26 | Discharge: 2020-06-27 | Disposition: A | Discharge status: Home / Self Care | Payer: Self-pay | Attending: Emergency Medicine | Admitting: Emergency Medicine

## 2020-06-26 ENCOUNTER — Encounter: Payer: Self-pay | Admitting: *Deleted

## 2020-06-26 ENCOUNTER — Other Ambulatory Visit: Payer: Self-pay

## 2020-06-26 DIAGNOSIS — E860 Dehydration: Secondary | ICD-10-CM | POA: Insufficient documentation

## 2020-06-26 DIAGNOSIS — R300 Dysuria: Secondary | ICD-10-CM | POA: Insufficient documentation

## 2020-06-26 DIAGNOSIS — F17210 Nicotine dependence, cigarettes, uncomplicated: Secondary | ICD-10-CM | POA: Insufficient documentation

## 2020-06-26 DIAGNOSIS — N390 Urinary tract infection, site not specified: Secondary | ICD-10-CM

## 2020-06-26 NOTE — ED Triage Notes (Signed)
Pt says that she has been under a lot of stress, feels dehydrated, and thinks she has a bladder infection.

## 2020-06-26 NOTE — ED Provider Notes (Signed)
[facility] Emergency Department Provider Note   ____________________________________________   Event Date/Time   First MD Initiated Contact with Patient 06/26/20 2231     (approximate)  I have reviewed the triage vital signs and the nursing notes.   HISTORY  Chief Complaint No chief complaint on file.    HPI Yolanda Reed is a 45 y.o. female with the below stated past medical history who presents for dehydration and dysuria.  Patient states that she has had decreased p.o. intake over the last 2 days with associated dark foul-smelling urine.  Patient denies any exacerbating or relieving factors.  Patient currently denies any vision changes, tinnitus, difficulty speaking, facial droop, sore throat, chest pain, shortness of breath, abdominal pain, nausea/vomiting/diarrhea, or weakness/numbness/paresthesias in any extremity         Past Medical History:  Diagnosis Date   Bipolar disorder (HCC) 09/20/2015   Gestational diabetes 10/19/1992   Hepatitis C    PTSD (post-traumatic stress disorder) 09/20/2015    Patient Active Problem List   Diagnosis Date Noted   Syphilis 07/30/2019   Tobacco abuse 07/21/2019   Chronic viral hepatitis C (HCC) 08/12/2018   Polysubstance abuse (HCC) 04/30/2007   History of attempted suicide 04/30/2007   Herpes simplex disease 08/26/1994    Past Surgical History:  Procedure Laterality Date   APPENDECTOMY     CESAREAN SECTION  1996    Prior to Admission medications   Medication Sig Start Date End Date Taking? Authorizing Provider  HYDROcodone-acetaminophen (NORCO) 5-325 MG tablet Take 1 tablet by mouth every 6 (six) hours as needed for moderate pain. 05/25/20   Evon Slack, PA-C    Allergies Morphine and related  Family History  Problem Relation Age of Onset   Diabetes Mother    Heart disease Mother    Hypertension Brother    Hyperlipidemia Brother    Kidney disease Maternal Grandmother    Diabetes  Maternal Grandfather    Hypertension Paternal Grandmother     Social History Social History   Tobacco Use   Smoking status: Every Day    Packs/day: 0.50    Years: 31.00    Pack years: 15.50    Types: Cigarettes   Smokeless tobacco: Never  Substance Use Topics   Alcohol use: No   Drug use: Yes    Types: Marijuana    Review of Systems Constitutional: No fever/chills Eyes: No visual changes. ENT: No sore throat. Cardiovascular: Denies chest pain. Respiratory: Denies shortness of breath. Gastrointestinal: No abdominal pain.  No nausea, no vomiting.  No diarrhea. Genitourinary: Positive for dysuria. Musculoskeletal: Negative for acute arthralgias Skin: Negative for rash. Neurological: Negative for headaches, weakness/numbness/paresthesias in any extremity Psychiatric: Negative for suicidal ideation/homicidal ideation   ____________________________________________   PHYSICAL EXAM:  VITAL SIGNS: ED Triage Vitals  Enc Vitals Group     BP 06/26/20 2213 (!) 126/99     Pulse Rate 06/26/20 2213 (!) 124     Resp 06/26/20 2213 20     Temp 06/26/20 2215 98.4 F (36.9 C)     Temp Source 06/26/20 2213 Oral     SpO2 06/26/20 2213 98 %     Weight --      Height --      Head Circumference --      Peak Flow --      Pain Score 06/26/20 2213 0     Pain Loc --      Pain Edu? --  Excl. in GC? --    Constitutional: Alert and oriented. Well appearing and in no acute distress. Eyes: Conjunctivae are normal. PERRL. Head: Atraumatic. Nose: No congestion/rhinnorhea. Mouth/Throat: Mucous membranes are moist. Neck: No stridor Cardiovascular: Grossly normal heart sounds.  Good peripheral circulation. Respiratory: Normal respiratory effort.  No retractions. Gastrointestinal: Soft and nontender. No distention. Musculoskeletal: No obvious deformities Neurologic:  Normal speech and language. No gross focal neurologic deficits are appreciated. Skin:  Skin is warm and dry. No rash  noted. Psychiatric: Mood and affect are normal. Speech and behavior are normal.  ____________________________________________   LABS (all labs ordered are listed, but only abnormal results are displayed)  Labs Reviewed  URINALYSIS, COMPLETE (UACMP) WITH MICROSCOPIC  POC URINE PREG, ED   PROCEDURES  Procedure(s) performed (including Critical Care):  .1-3 Lead EKG Interpretation  Date/Time: 06/26/2020 11:42 PM Performed by: Merwyn Katos, MD Authorized by: Merwyn Katos, MD     Interpretation: abnormal     ECG rate:  117   ECG rate assessment: tachycardic     Rhythm: sinus rhythm     Ectopy: none     Conduction: normal     ____________________________________________   INITIAL IMPRESSION / ASSESSMENT AND PLAN / ED COURSE  As part of my medical decision making, I reviewed the following data within the electronic MEDICAL RECORD NUMBER Nursing notes reviewed and incorporated, Labs reviewed, EKG interpreted, Old chart reviewed, Radiograph reviewed and Notes from prior ED visits reviewed and incorporated     This patient presents with generalized weakness and fatigue likely secondary to dehydration. Suspect acute kidney injury of prerenal origin. Doubt intrinsic renal dysfunction or obstructive nephropathy. Considered alternate etiologies of the patients symptoms including infectious processes, severe metabolic derangements or electrolyte abnormalities, ischemia/ACS, heart failure, and intracranial/central processes but think these are unlikely given the history and physical exam.  Plan: labs, oral fluid resuscitation, pain/nausea control, reassessment UA pending Care of this patient will be signed out to the oncoming physician at the end of my shift.  All pertinent patient information conveyed and all questions answered.  All further care and disposition decisions will be made by the oncoming physician.     ____________________________________________   FINAL CLINICAL  IMPRESSION(S) / ED DIAGNOSES  Final diagnoses:  Dehydration  Dysuria     ED Discharge Orders     None        Note:  This document was prepared using Dragon voice recognition software and may include unintentional dictation errors.    Merwyn Katos, MD 06/26/20 [phone]

## 2020-06-27 LAB — CBC WITH DIFFERENTIAL/PLATELET
Abs Immature Granulocytes: 0.04 10*3/uL (ref 0.00–0.07)
Basophils Absolute: 0.1 10*3/uL (ref 0.0–0.1)
Basophils Relative: 0 %
Eosinophils Absolute: 0 10*3/uL (ref 0.0–0.5)
Eosinophils Relative: 0 %
HCT: 36.6 % (ref 36.0–46.0)
Hemoglobin: 13 g/dL (ref 12.0–15.0)
Immature Granulocytes: 0 %
Lymphocytes Relative: 23 %
Lymphs Abs: 2.8 10*3/uL (ref 0.7–4.0)
MCH: 31.3 pg (ref 26.0–34.0)
MCHC: 35.5 g/dL (ref 30.0–36.0)
MCV: 88 fL (ref 80.0–100.0)
Monocytes Absolute: 1.7 10*3/uL — ABNORMAL HIGH (ref 0.1–1.0)
Monocytes Relative: 14 %
Neutro Abs: 7.6 10*3/uL (ref 1.7–7.7)
Neutrophils Relative %: 63 %
Platelets: 322 10*3/uL (ref 150–400)
RBC: 4.16 MIL/uL (ref 3.87–5.11)
RDW: 12.5 % (ref 11.5–15.5)
WBC: 12.3 10*3/uL — ABNORMAL HIGH (ref 4.0–10.5)
nRBC: 0 % (ref 0.0–0.2)

## 2020-06-27 LAB — BASIC METABOLIC PANEL
Anion gap: 10 (ref 5–15)
BUN: 25 mg/dL — ABNORMAL HIGH (ref 6–20)
CO2: 24 mmol/L (ref 22–32)
Calcium: 8.7 mg/dL — ABNORMAL LOW (ref 8.9–10.3)
Chloride: 98 mmol/L (ref 98–111)
Creatinine, Ser: 0.64 mg/dL (ref 0.44–1.00)
GFR, Estimated: >60 mL/min (ref >60)
Glucose, Bld: 91 mg/dL (ref 70–99)
Potassium: 3 mmol/L — ABNORMAL LOW (ref 3.5–5.1)
Sodium: 132 mmol/L — ABNORMAL LOW (ref 135–145)

## 2020-06-27 LAB — URINALYSIS, COMPLETE (UACMP) WITH MICROSCOPIC
Bacteria, UA: NONE SEEN
Bilirubin Urine: NEGATIVE
Glucose, UA: NEGATIVE mg/dL
Hgb urine dipstick: NEGATIVE
Ketones, ur: 80 mg/dL — AB
Nitrite: NEGATIVE
Protein, ur: 30 mg/dL — AB
Specific Gravity, Urine: 1.027 (ref 1.005–1.030)
pH: 6 (ref 5.0–8.0)

## 2020-06-27 LAB — POC URINE PREG, ED: Preg Test, Ur: NEGATIVE

## 2020-06-27 MED ORDER — SODIUM CHLORIDE 0.9 % IV BOLUS
1000.0000 mL | Freq: Once | INTRAVENOUS | Status: AC
Start: 2020-06-27 — End: 2020-06-27
  Administered 2020-06-27: 1000 mL via INTRAVENOUS

## 2020-06-27 MED ORDER — NITROFURANTOIN MONOHYD MACRO 100 MG PO CAPS: 100.0000 mg | ORAL_CAPSULE | Freq: Two times a day (BID) | ORAL | 0 refills | Status: AC

## 2020-06-27 NOTE — Discharge Instructions (Addendum)
Take the antibiotic as prescribed and finish the full 5-day course.  Make sure to drink plenty of fluids.    Return to the ER for new, worsening, or persistent discomfort with urination, weakness or lightheadedness, fever or chills, vomiting, or any other new or worsening symptoms that concern you.

## 2020-06-27 NOTE — ED Provider Notes (Signed)
-----------------------------------------   1:39 AM on 06/27/2020 -----------------------------------------  I took over care of this patient from Dr. Vicente Males.  Patient was pending urinalysis and basic labs, and I ordered a liter of NS in the meantime.  Lab work-up is unremarkable except for mild leukocytosis and borderline hypokalemia not requiring treatment.  The urinalysis shows some ketones consistent with mild dehydration and WBCs suggestive of UTI.  On reassessment, the patient appears comfortable.  Her vital signs are normal.  Her tachycardia at triage had resolved even prior to fluids being given.  At this time, the patient is stable for discharge home.  I counseled her on the results of the work-up.  I will prescribe a 5-day course of Macrobid.  I gave her thorough return precautions and she expressed understanding.   Dionne Bucy, MD 06/27/20 [phone]

## 2020-08-09 ENCOUNTER — Other Ambulatory Visit: Payer: Self-pay

## 2020-08-09 ENCOUNTER — Emergency Department
Admission: EM | Admit: 2020-08-09 | Discharge: 2020-08-09 | Disposition: A | Discharge status: Home / Self Care | Payer: Self-pay | Attending: Emergency Medicine | Admitting: Emergency Medicine

## 2020-08-09 DIAGNOSIS — M7541 Impingement syndrome of right shoulder: Secondary | ICD-10-CM | POA: Insufficient documentation

## 2020-08-09 DIAGNOSIS — F17210 Nicotine dependence, cigarettes, uncomplicated: Secondary | ICD-10-CM | POA: Insufficient documentation

## 2020-08-09 DIAGNOSIS — M5412 Radiculopathy, cervical region: Secondary | ICD-10-CM | POA: Insufficient documentation

## 2020-08-09 MED ORDER — PREDNISONE 20 MG PO TABS
60.0000 mg | ORAL_TABLET | Freq: Once | ORAL | Status: AC
  Administered 2020-08-09: 60 mg via ORAL
  Filled 2020-08-09: qty 3

## 2020-08-09 MED ORDER — METHOCARBAMOL 750 MG PO TABS: 750.0000 mg | ORAL_TABLET | Freq: Four times a day (QID) | ORAL | 0 refills | Status: AC | PRN

## 2020-08-09 MED ORDER — PREDNISONE 10 MG PO TABS: ORAL_TABLET | ORAL | 0 refills | Status: AC

## 2020-08-09 MED ORDER — ACETAMINOPHEN 500 MG PO TABS
1000.0000 mg | ORAL_TABLET | Freq: Once | ORAL | Status: AC
  Administered 2020-08-09: 1000 mg via ORAL
  Filled 2020-08-09: qty 2

## 2020-08-09 MED ORDER — METHOCARBAMOL 750 MG PO TABS
750.0000 mg | ORAL_TABLET | Freq: Once | ORAL | Status: AC
  Administered 2020-08-09: 750 mg via ORAL
  Filled 2020-08-09 (×2): qty 1

## 2020-08-09 NOTE — ED Notes (Signed)
Pharmacy called for missing med

## 2020-08-09 NOTE — ED Triage Notes (Signed)
Pt here with R shoulder pain for 2 weeks and neck pain. Pt reports a kink in her neck and now she can't turn. Pt cannot pick thinks up to her arm hurting, she believes it is a pinched nerve in her neck.

## 2020-08-09 NOTE — ED Provider Notes (Signed)
[facility] Emergency Department Provider Note  ____________________________________________   Event Date/Time   First MD Initiated Contact with Patient 08/09/20 1754     (approximate)  I have reviewed the triage vital signs and the nursing notes.   HISTORY  Chief Complaint Shoulder Pain  HPI Yolanda Reed is a 45 y.o. female who presents to the emergency department for evaluation of right shoulder pain and right-sided neck pain.  Patient states that shoulder pain first began 2 weeks ago after moving a booth at work.  She states pain is worse with movement above head, improved with lying at rest.  She also reports right-sided neck pain that began upon waking up 1 morning 3 days ago and believes that she may have slept wrong.  She denies any fever, headache, blurred vision or other associated symptoms.  Does report that range of motion is decreased of her neck.  She denies any trauma.         Past Medical History:  Diagnosis Date   Bipolar disorder (HCC) 09/20/2015   Gestational diabetes 10/19/1992   Hepatitis C    PTSD (post-traumatic stress disorder) 09/20/2015    Patient Active Problem List   Diagnosis Date Noted   Syphilis 07/30/2019   Tobacco abuse 07/21/2019   Chronic viral hepatitis C (HCC) 08/12/2018   Polysubstance abuse (HCC) 04/30/2007   History of attempted suicide 04/30/2007   Herpes simplex disease 08/26/1994    Past Surgical History:  Procedure Laterality Date   APPENDECTOMY     CESAREAN SECTION  1996    Prior to Admission medications   Medication Sig Start Date End Date Taking? Authorizing Provider  methocarbamol (ROBAXIN-750) 750 MG tablet Take 1 tablet (750 mg total) by mouth 4 (four) times daily as needed for up to 10 days for muscle spasms. 08/09/20 08/19/20 Yes Nowling, Ruben Gottron, PA  predniSONE (DELTASONE) 10 MG tablet Take 6 tablets (60 mg total) by mouth daily for 1 day, THEN 5 tablets (50 mg total) daily for 1 day, THEN  4 tablets (40 mg total) daily for 1 day, THEN 3 tablets (30 mg total) daily for 1 day, THEN 2 tablets (20 mg total) daily for 1 day, THEN 1 tablet (10 mg total) daily for 1 day. 08/09/20 08/15/20 Yes Abbey, Ruben Gottron, PA  HYDROcodone-acetaminophen (NORCO) 5-325 MG tablet Take 1 tablet by mouth every 6 (six) hours as needed for moderate pain. 05/25/20   Evon Slack, PA-C    Allergies Morphine and related  Family History  Problem Relation Age of Onset   Diabetes Mother    Heart disease Mother    Hypertension Brother    Hyperlipidemia Brother    Kidney disease Maternal Grandmother    Diabetes Maternal Grandfather    Hypertension Paternal Grandmother     Social History Social History   Tobacco Use   Smoking status: Every Day    Packs/day: 0.50    Years: 31.00    Pack years: 15.50    Types: Cigarettes   Smokeless tobacco: Never  Substance Use Topics   Alcohol use: No   Drug use: Yes    Types: Marijuana    Review of Systems Constitutional: No fever/chills Eyes: No visual changes. ENT: No sore throat. Cardiovascular: Denies chest pain. Respiratory: Denies shortness of breath. Gastrointestinal: No abdominal pain.  No nausea, no vomiting.  No diarrhea.  No constipation. Genitourinary: Negative for dysuria. Musculoskeletal: + Neck pain,+ right shoulder pain Skin: Negative for rash. Neurological: Negative  for headaches, focal weakness or numbness.  ____________________________________________   PHYSICAL EXAM:  VITAL SIGNS: ED Triage Vitals  Enc Vitals Group     BP 08/09/20 1607 109/82     Pulse Rate 08/09/20 1607 91     Resp 08/09/20 1607 18     Temp 08/09/20 1607 98.8 F (37.1 C)     Temp Source 08/09/20 1607 Oral     SpO2 08/09/20 1607 98 %     Weight 08/09/20 1609 143 lb 11.2 oz (65.2 kg)     Height 08/09/20 1609 5\' 3"  (1.6 m)     Head Circumference --      Peak Flow --      Pain Score 08/09/20 1609 10     Pain Loc --      Pain Edu? --      Excl. in GC?  --    Constitutional: Alert and oriented. Well appearing and in no acute distress. Eyes: Conjunctivae are normal. PERRL. EOMI. Head: Atraumatic. Nose: No congestion/rhinnorhea. Mouth/Throat: Mucous membranes are moist.  Oropharynx non-erythematous. Neck: No stridor.  There is tenderness to palpation of the right paraspinal musculature and into the right traps.  No midline tenderness.  No meningeal signs.  Range of motion limited in right lateral flexion, right cervical rotation secondary to pain. Musculoskeletal: No tenderness to palpation of the right shoulder joint.  Range of motion limited over 90 degrees secondary to pain.  5/5 strength in internal and external rotation at 0 degrees of shoulder abduction or flexion.  Grip strength equal and symmetric bilaterally, radial pulse 2+. Neurologic:  Normal speech and language. No gross focal neurologic deficits are appreciated. No gait instability. Skin:  Skin is warm, dry and intact. No rash noted. Psychiatric: Mood and affect are normal. Speech and behavior are normal.    ____________________________________________   INITIAL IMPRESSION / ASSESSMENT AND PLAN / ED COURSE  As part of my medical decision making, I reviewed the following data within the electronic MEDICAL RECORD NUMBER Nursing notes reviewed and incorporated and Notes from prior ED visits        Patient is a 45 year old female who presents to the emergency department for evaluation of neck pain and right shoulder pain.  See HPI for further details.  Notably, the patient denies any fever, headache, blurred vision though she does report difficulty with range of motion of her neck.  In triage patient has normal vital signs.  On physical exam, patient has mostly right-sided pain of the neck, no midline pain.  No meningeal signs.  Shoulder exam most consistent with subacromial impingement in the absence of any trauma.  Mutual decision making was had with the patient to defer x-rays  given no trauma.  Will attempt treatment with prednisone Dosepak, muscle relaxant and Tylenol.  Patient is amenable with this plan and return precautions were discussed and she stable this time for outpatient follow-up.      ____________________________________________   FINAL CLINICAL IMPRESSION(S) / ED DIAGNOSES  Final diagnoses:  Cervical radiculopathy  Impingement syndrome of right shoulder     ED Discharge Orders          Ordered    predniSONE (DELTASONE) 10 MG tablet        08/09/20 1812    methocarbamol (ROBAXIN-750) 750 MG tablet  4 times daily PRN        08/09/20 1812             Note:  This document was prepared using Dragon  voice recognition software and may include unintentional dictation errors.    Hagen, Tidd, PA 08/09/20 Terence Lux    Delton [place], MD 08/09/20 2056

## 2020-09-02 ENCOUNTER — Encounter: Payer: Self-pay | Admitting: Emergency Medicine

## 2020-09-02 ENCOUNTER — Other Ambulatory Visit: Payer: Self-pay

## 2020-09-02 ENCOUNTER — Emergency Department
Admission: EM | Admit: 2020-09-02 | Discharge: 2020-09-02 | Disposition: A | Discharge status: Home / Self Care | Payer: Self-pay | Attending: Emergency Medicine | Admitting: Emergency Medicine

## 2020-09-02 DIAGNOSIS — F17210 Nicotine dependence, cigarettes, uncomplicated: Secondary | ICD-10-CM | POA: Insufficient documentation

## 2020-09-02 DIAGNOSIS — F419 Anxiety disorder, unspecified: Secondary | ICD-10-CM | POA: Insufficient documentation

## 2020-09-02 MED ORDER — HYDROXYZINE HCL 25 MG PO TABS
25.0000 mg | ORAL_TABLET | Freq: Once | ORAL | Status: AC
  Administered 2020-09-02: 25 mg via ORAL
  Filled 2020-09-02: qty 1

## 2020-09-02 NOTE — ED Triage Notes (Signed)
Pt reports something happened between her and her man and she is having a hard time dealing with it. Pt reports she has a lot of anxiety from it. Pt reports is supposed to be at work so will need a note. Pt reports has been depressed here lately because everything has been going wrong. Denies SI/HI

## 2020-09-02 NOTE — Discharge Instructions (Addendum)
Please seek medical attention and help for any thoughts about wanting to harm yourself, harm others, any concerning change in behavior, severe depression, inappropriate drug use or any other new or concerning symptoms. ° °

## 2020-09-02 NOTE — ED Notes (Signed)
Pt resting comfortably at this time. Plan to dispo when patients ride gets here.

## 2020-09-02 NOTE — ED Provider Notes (Signed)
[facility] Emergency Department Provider Note  ____________________________________________   I have reviewed the triage vital signs and the nursing notes.   HISTORY  Chief Complaint Anxiety   History limited by: Not Limited   HPI Yolanda Reed is a 45 y.o. female who presents to the emergency department today because of concerns for anxiety.  Patient states that she has been going through some things with her boyfriend.  She states that she has had problems with anxiety in the past.  Patient denies any thoughts of wanting to hurt her self or others.  Patient denies any medical complaints.   Records reviewed. Per medical record review patient has a history of bipolar  Past Medical History:  Diagnosis Date   Bipolar disorder (HCC) 09/20/2015   Gestational diabetes 10/19/1992   Hepatitis C    PTSD (post-traumatic stress disorder) 09/20/2015    Patient Active Problem List   Diagnosis Date Noted   Syphilis 07/30/2019   Tobacco abuse 07/21/2019   Chronic viral hepatitis C (HCC) 08/12/2018   Polysubstance abuse (HCC) 04/30/2007   History of attempted suicide 04/30/2007   Herpes simplex disease 08/26/1994    Past Surgical History:  Procedure Laterality Date   APPENDECTOMY     CESAREAN SECTION  1996    Prior to Admission medications   Medication Sig Start Date End Date Taking? Authorizing Provider  HYDROcodone-acetaminophen (NORCO) 5-325 MG tablet Take 1 tablet by mouth every 6 (six) hours as needed for moderate pain. 05/25/20   Evon Slack, PA-C    Allergies Morphine and related  Family History  Problem Relation Age of Onset   Diabetes Mother    Heart disease Mother    Hypertension Brother    Hyperlipidemia Brother    Kidney disease Maternal Grandmother    Diabetes Maternal Grandfather    Hypertension Paternal Grandmother     Social History Social History   Tobacco Use   Smoking status: Every Day    Packs/day: 0.50    Years:  31.00    Pack years: 15.50    Types: Cigarettes   Smokeless tobacco: Never  Substance Use Topics   Alcohol use: No   Drug use: Yes    Types: Marijuana    Review of Systems Constitutional: No fever/chills Eyes: No visual changes. ENT: No sore throat. Cardiovascular: Denies chest pain. Respiratory: Denies shortness of breath. Gastrointestinal: No abdominal pain.  No nausea, no vomiting.  No diarrhea.   Genitourinary: Negative for dysuria. Musculoskeletal: Negative for back pain. Skin: Negative for rash. Neurological: Negative for headaches, focal weakness or numbness.  ____________________________________________   PHYSICAL EXAM:  VITAL SIGNS: ED Triage Vitals  Enc Vitals Group     BP 09/02/20 1137 117/88     Pulse Rate 09/02/20 1137 91     Resp 09/02/20 1137 20     Temp 09/02/20 1137 98.5 F (36.9 C)     Temp Source 09/02/20 1137 Oral     SpO2 09/02/20 1137 96 %     Weight 09/02/20 1138 140 lb (63.5 kg)     Height 09/02/20 1138 5\' 3"  (1.6 m)     Head Circumference --      Peak Flow --      Pain Score 09/02/20 1137 5    Constitutional: Alert and oriented.  Eyes: Conjunctivae are normal.  ENT      Head: Normocephalic and atraumatic.      Nose: No congestion/rhinnorhea.      Mouth/Throat: Mucous membranes  are moist.      Neck: No stridor. Hematological/Lymphatic/Immunilogical: No cervical lymphadenopathy. Cardiovascular: Normal rate, regular rhythm.  No murmurs, rubs, or gallops.  Respiratory: Normal respiratory effort without tachypnea nor retractions. Breath sounds are clear and equal bilaterally. No wheezes/rales/rhonchi. Gastrointestinal: Soft and non tender. No rebound. No guarding.  Genitourinary: Deferred Musculoskeletal: Normal range of motion in all extremities. No lower extremity edema. Neurologic:  Normal speech and language. No gross focal neurologic deficits are appreciated.  Skin:  Skin is warm, dry and intact. No rash noted. Psychiatric: Mood and  affect are normal. Speech and behavior are normal. Patient exhibits appropriate insight and judgment.  ____________________________________________    LABS (pertinent positives/negatives)  None  ____________________________________________   EKG  None  ____________________________________________    RADIOLOGY  None  ____________________________________________   PROCEDURES  Procedures  ____________________________________________   INITIAL IMPRESSION / ASSESSMENT AND PLAN / ED COURSE  Pertinent labs & imaging results that were available during my care of the patient were reviewed by me and considered in my medical decision making (see chart for details).   Patient presented to the emergency department today because of concerns for anxiety.  Patient denies any SI.  Will give patient dose of Atarax.  Do think patient would benefit from outpatient follow-up.  ___________________________________________   FINAL CLINICAL IMPRESSION(S) / ED DIAGNOSES  Final diagnoses:  Anxiety     Note: This dictation was prepared with Dragon dictation. Any transcriptional errors that result from this process are unintentional     Phineas Semen, MD 09/02/20 [phone]

## 2020-11-06 ENCOUNTER — Other Ambulatory Visit: Payer: Self-pay

## 2020-11-06 ENCOUNTER — Encounter: Payer: Self-pay | Admitting: Emergency Medicine

## 2020-11-06 DIAGNOSIS — A5402 Gonococcal vulvovaginitis, unspecified: Secondary | ICD-10-CM | POA: Diagnosis present

## 2020-11-06 DIAGNOSIS — N760 Acute vaginitis: Secondary | ICD-10-CM | POA: Diagnosis present

## 2020-11-06 DIAGNOSIS — F1490 Cocaine use, unspecified, uncomplicated: Secondary | ICD-10-CM | POA: Diagnosis present

## 2020-11-06 DIAGNOSIS — F1290 Cannabis use, unspecified, uncomplicated: Secondary | ICD-10-CM | POA: Diagnosis present

## 2020-11-06 DIAGNOSIS — N739 Female pelvic inflammatory disease, unspecified: Principal | ICD-10-CM | POA: Diagnosis present

## 2020-11-06 DIAGNOSIS — F1190 Opioid use, unspecified, uncomplicated: Secondary | ICD-10-CM | POA: Diagnosis present

## 2020-11-06 DIAGNOSIS — F17210 Nicotine dependence, cigarettes, uncomplicated: Secondary | ICD-10-CM | POA: Diagnosis present

## 2020-11-06 DIAGNOSIS — B9689 Other specified bacterial agents as the cause of diseases classified elsewhere: Secondary | ICD-10-CM | POA: Diagnosis present

## 2020-11-06 DIAGNOSIS — Z20822 Contact with and (suspected) exposure to covid-19: Secondary | ICD-10-CM | POA: Diagnosis present

## 2020-11-06 LAB — COMPREHENSIVE METABOLIC PANEL
ALT: 25 U/L (ref 0–44)
AST: 19 U/L (ref 15–41)
Albumin: 4 g/dL (ref 3.5–5.0)
Alkaline Phosphatase: 91 U/L (ref 38–126)
Anion gap: 9 (ref 5–15)
BUN: 12 mg/dL (ref 6–20)
CO2: 26 mmol/L (ref 22–32)
Calcium: 9.4 mg/dL (ref 8.9–10.3)
Chloride: 101 mmol/L (ref 98–111)
Creatinine, Ser: 0.73 mg/dL (ref 0.44–1.00)
GFR, Estimated: >60 mL/min (ref >60)
Glucose, Bld: 133 mg/dL — ABNORMAL HIGH (ref 70–99)
Potassium: 3.9 mmol/L (ref 3.5–5.1)
Sodium: 136 mmol/L (ref 135–145)
Total Bilirubin: 1 mg/dL (ref 0.3–1.2)
Total Protein: 8.3 g/dL — ABNORMAL HIGH (ref 6.5–8.1)

## 2020-11-06 LAB — URINALYSIS, ROUTINE W REFLEX MICROSCOPIC
Glucose, UA: NEGATIVE mg/dL
Ketones, ur: >160 mg/dL — AB
Nitrite: NEGATIVE
Protein, ur: 30 mg/dL — AB
Specific Gravity, Urine: 1.025 (ref 1.005–1.030)
pH: 6 (ref 5.0–8.0)

## 2020-11-06 LAB — CBC
HCT: 47.1 % — ABNORMAL HIGH (ref 36.0–46.0)
Hemoglobin: 15.9 g/dL — ABNORMAL HIGH (ref 12.0–15.0)
MCH: 31.5 pg (ref 26.0–34.0)
MCHC: 33.8 g/dL (ref 30.0–36.0)
MCV: 93.3 fL (ref 80.0–100.0)
Platelets: 369 10*3/uL (ref 150–400)
RBC: 5.05 MIL/uL (ref 3.87–5.11)
RDW: 13.1 % (ref 11.5–15.5)
WBC: 20.8 10*3/uL — ABNORMAL HIGH (ref 4.0–10.5)
nRBC: 0 % (ref 0.0–0.2)

## 2020-11-06 LAB — URINALYSIS, MICROSCOPIC (REFLEX): Bacteria, UA: NONE SEEN

## 2020-11-06 LAB — LIPASE, BLOOD: Lipase: 28 U/L (ref 11–51)

## 2020-11-06 LAB — POC URINE PREG, ED: Preg Test, Ur: NEGATIVE

## 2020-11-06 MED ORDER — ACETAMINOPHEN 325 MG PO TABS
650.0000 mg | ORAL_TABLET | Freq: Once | ORAL | Status: DC | PRN
  Filled 2020-11-06: qty 2

## 2020-11-06 MED ORDER — ONDANSETRON 4 MG PO TBDP
4.0000 mg | ORAL_TABLET | Freq: Once | ORAL | Status: DC | PRN
  Filled 2020-11-06: qty 1

## 2020-11-06 NOTE — ED Triage Notes (Signed)
EMS brings pt in from home for c/o lower abd pain x 2 days, currently menstruating

## 2020-11-06 NOTE — ED Triage Notes (Signed)
Pt to ED via EMS from home c/o lower mid abd pain x3 days, n/v, denies urinary changes but states vaginal discharge with odor.  Pt states had cold sweats at home.  Pt A&Ox4, chest rise even and unlabored, appears uncomfortable in triage.

## 2020-11-07 ENCOUNTER — Emergency Department: Payer: Self-pay

## 2020-11-07 ENCOUNTER — Inpatient Hospital Stay
Admission: EM | Admit: 2020-11-07 | Discharge: 2020-11-09 | DRG: 758 | Disposition: A | Discharge status: Home / Self Care | Payer: Self-pay | Attending: Obstetrics and Gynecology | Admitting: Obstetrics and Gynecology

## 2020-11-07 DIAGNOSIS — N730 Acute parametritis and pelvic cellulitis: Secondary | ICD-10-CM

## 2020-11-07 DIAGNOSIS — R509 Fever, unspecified: Secondary | ICD-10-CM

## 2020-11-07 DIAGNOSIS — N390 Urinary tract infection, site not specified: Secondary | ICD-10-CM

## 2020-11-07 DIAGNOSIS — A549 Gonococcal infection, unspecified: Secondary | ICD-10-CM

## 2020-11-07 DIAGNOSIS — R102 Pelvic and perineal pain: Secondary | ICD-10-CM

## 2020-11-07 DIAGNOSIS — R1030 Lower abdominal pain, unspecified: Secondary | ICD-10-CM

## 2020-11-07 DIAGNOSIS — N739 Female pelvic inflammatory disease, unspecified: Secondary | ICD-10-CM | POA: Diagnosis present

## 2020-11-07 DIAGNOSIS — N760 Acute vaginitis: Secondary | ICD-10-CM

## 2020-11-07 DIAGNOSIS — B9689 Other specified bacterial agents as the cause of diseases classified elsewhere: Secondary | ICD-10-CM

## 2020-11-07 DIAGNOSIS — R112 Nausea with vomiting, unspecified: Secondary | ICD-10-CM

## 2020-11-07 DIAGNOSIS — R52 Pain, unspecified: Secondary | ICD-10-CM

## 2020-11-07 DIAGNOSIS — N7093 Salpingitis and oophoritis, unspecified: Secondary | ICD-10-CM

## 2020-11-07 DIAGNOSIS — E860 Dehydration: Secondary | ICD-10-CM

## 2020-11-07 LAB — URINE DRUG SCREEN, QUALITATIVE (ARMC ONLY)
Amphetamines, Ur Screen: NOT DETECTED
Barbiturates, Ur Screen: NOT DETECTED
Benzodiazepine, Ur Scrn: NOT DETECTED
Cannabinoid 50 Ng, Ur ~~LOC~~: POSITIVE — AB
Cocaine Metabolite,Ur ~~LOC~~: POSITIVE — AB
MDMA (Ecstasy)Ur Screen: NOT DETECTED
Methadone Scn, Ur: NOT DETECTED
Opiate, Ur Screen: POSITIVE — AB
Phencyclidine (PCP) Ur S: NOT DETECTED
Tricyclic, Ur Screen: NOT DETECTED

## 2020-11-07 LAB — CBC WITH DIFFERENTIAL/PLATELET
Abs Immature Granulocytes: 0.12 K/uL — ABNORMAL HIGH (ref 0.00–0.07)
Basophils Absolute: 0.1 K/uL (ref 0.0–0.1)
Basophils Relative: 0 %
Eosinophils Absolute: 0.1 K/uL (ref 0.0–0.5)
Eosinophils Relative: 0 %
HCT: 49.1 % — ABNORMAL HIGH (ref 36.0–46.0)
Hemoglobin: 15.8 g/dL — ABNORMAL HIGH (ref 12.0–15.0)
Immature Granulocytes: 1 %
Lymphocytes Relative: 9 %
Lymphs Abs: 1.9 K/uL (ref 0.7–4.0)
MCH: 29.9 pg (ref 26.0–34.0)
MCHC: 32.2 g/dL (ref 30.0–36.0)
MCV: 93 fL (ref 80.0–100.0)
Monocytes Absolute: 0.8 K/uL (ref 0.1–1.0)
Monocytes Relative: 4 %
Neutro Abs: 17.8 K/uL — ABNORMAL HIGH (ref 1.7–7.7)
Neutrophils Relative %: 86 %
Platelets: 407 K/uL — ABNORMAL HIGH (ref 150–400)
RBC: 5.28 MIL/uL — ABNORMAL HIGH (ref 3.87–5.11)
RDW: 13.1 % (ref 11.5–15.5)
WBC: 20.7 K/uL — ABNORMAL HIGH (ref 4.0–10.5)
nRBC: 0 % (ref 0.0–0.2)

## 2020-11-07 LAB — WET PREP, GENITAL
Sperm: NONE SEEN
Trich, Wet Prep: NONE SEEN
Yeast Wet Prep HPF POC: NONE SEEN

## 2020-11-07 LAB — CHLAMYDIA/NGC RT PCR (ARMC ONLY)
Chlamydia Tr: NOT DETECTED
N gonorrhoeae: DETECTED — AB

## 2020-11-07 LAB — URINE CULTURE: Culture: NO GROWTH

## 2020-11-07 LAB — LACTIC ACID, PLASMA: Lactic Acid, Venous: 1 mmol/L (ref 0.5–1.9)

## 2020-11-07 LAB — HEPATITIS B SURFACE ANTIGEN: Hepatitis B Surface Ag: NONREACTIVE

## 2020-11-07 LAB — RESP PANEL BY RT-PCR (FLU A&B, COVID) ARPGX2
Influenza A by PCR: NEGATIVE
Influenza B by PCR: NEGATIVE
SARS Coronavirus 2 by RT PCR: NEGATIVE

## 2020-11-07 LAB — HIV ANTIBODY (ROUTINE TESTING W REFLEX): HIV Screen 4th Generation wRfx: NONREACTIVE

## 2020-11-07 MED ORDER — SODIUM CHLORIDE 0.9 % IV BOLUS
1000.0000 mL | Freq: Once | INTRAVENOUS | Status: AC
  Administered 2020-11-07: 1000 mL via INTRAVENOUS

## 2020-11-07 MED ORDER — HYDROCODONE-ACETAMINOPHEN 5-325 MG PO TABS
1.0000 | ORAL_TABLET | Freq: Four times a day (QID) | ORAL | Status: DC | PRN
  Administered 2020-11-07 (×2): 2 via ORAL
  Filled 2020-11-07 (×2): qty 2

## 2020-11-07 MED ORDER — SODIUM CHLORIDE 0.9 % IV SOLN
1.0000 g | Freq: Every day | INTRAVENOUS | Status: DC
  Administered 2020-11-08 – 2020-11-09 (×2): 1 g via INTRAVENOUS
  Filled 2020-11-07: qty 10
  Filled 2020-11-07: qty 1

## 2020-11-07 MED ORDER — ACETAMINOPHEN 500 MG PO TABS
ORAL_TABLET | ORAL | Status: AC
  Filled 2020-11-07: qty 1

## 2020-11-07 MED ORDER — FENTANYL CITRATE PF 50 MCG/ML IJ SOSY
50.0000 ug | PREFILLED_SYRINGE | Freq: Once | INTRAMUSCULAR | Status: AC
  Administered 2020-11-07: 50 ug via INTRAVENOUS
  Filled 2020-11-07: qty 1

## 2020-11-07 MED ORDER — SODIUM CHLORIDE 0.9 % IV SOLN
1.0000 g | Freq: Once | INTRAVENOUS | Status: AC
  Administered 2020-11-07: 1 g via INTRAVENOUS
  Filled 2020-11-07: qty 10

## 2020-11-07 MED ORDER — DIPHENHYDRAMINE HCL 50 MG/ML IJ SOLN
12.5000 mg | Freq: Four times a day (QID) | INTRAMUSCULAR | Status: DC | PRN
  Administered 2020-11-07: 12.5 mg via INTRAVENOUS
  Filled 2020-11-07: qty 1

## 2020-11-07 MED ORDER — ACETAMINOPHEN 500 MG PO TABS
1000.0000 mg | ORAL_TABLET | Freq: Four times a day (QID) | ORAL | Status: DC | PRN
  Filled 2020-11-07: qty 2

## 2020-11-07 MED ORDER — METRONIDAZOLE 500 MG/100ML IV SOLN
500.0000 mg | Freq: Two times a day (BID) | INTRAVENOUS | Status: DC
  Administered 2020-11-07 – 2020-11-09 (×5): 500 mg via INTRAVENOUS
  Filled 2020-11-07 (×8): qty 100

## 2020-11-07 MED ORDER — SODIUM CHLORIDE 0.9 % IV SOLN
100.0000 mg | Freq: Two times a day (BID) | INTRAVENOUS | Status: DC
  Administered 2020-11-07 – 2020-11-09 (×4): 100 mg via INTRAVENOUS
  Filled 2020-11-07 (×5): qty 100

## 2020-11-07 MED ORDER — MORPHINE SULFATE (PF) 2 MG/ML IV SOLN
1.0000 mg | INTRAVENOUS | Status: DC | PRN
  Administered 2020-11-07 – 2020-11-08 (×4): 1 mg via INTRAVENOUS
  Filled 2020-11-07 (×4): qty 1

## 2020-11-07 MED ORDER — SODIUM CHLORIDE 0.9 % IV SOLN: INTRAVENOUS | Status: DC | PRN

## 2020-11-07 MED ORDER — IOHEXOL 300 MG/ML  SOLN
100.0000 mL | Freq: Once | INTRAMUSCULAR | Status: AC | PRN
  Administered 2020-11-07: 100 mL via INTRAVENOUS
  Filled 2020-11-07: qty 100

## 2020-11-07 MED ORDER — ONDANSETRON 4 MG PO TBDP
4.0000 mg | ORAL_TABLET | Freq: Three times a day (TID) | ORAL | Status: AC | PRN
  Administered 2020-11-07: 4 mg via ORAL
  Filled 2020-11-07: qty 1

## 2020-11-07 MED ORDER — ACETAMINOPHEN 500 MG PO TABS
1000.0000 mg | ORAL_TABLET | Freq: Once | ORAL | Status: AC
  Administered 2020-11-07: 1000 mg via ORAL
  Filled 2020-11-07: qty 2

## 2020-11-07 MED ORDER — SODIUM CHLORIDE 0.9 % IV SOLN
100.0000 mg | Freq: Once | INTRAVENOUS | Status: AC
  Administered 2020-11-07: 100 mg via INTRAVENOUS
  Filled 2020-11-07: qty 100

## 2020-11-07 MED ORDER — ONDANSETRON HCL 4 MG/2ML IJ SOLN
4.0000 mg | Freq: Once | INTRAMUSCULAR | Status: AC
  Administered 2020-11-07: 4 mg via INTRAVENOUS
  Filled 2020-11-07: qty 2

## 2020-11-07 MED ORDER — OXYCODONE-ACETAMINOPHEN 5-325 MG PO TABS
1.0000 | ORAL_TABLET | ORAL | Status: DC | PRN
  Administered 2020-11-07 – 2020-11-09 (×7): 2 via ORAL
  Filled 2020-11-07 (×8): qty 2

## 2020-11-07 NOTE — ED Notes (Signed)
Nate RN aware of assigned bed 

## 2020-11-07 NOTE — ED Provider Notes (Addendum)
[facility] Emergency Department Provider Note   ____________________________________________   Event Date/Time   First MD Initiated Contact with Patient 11/07/20 0037     (approximate)  I have reviewed the triage vital signs and the nursing notes.   HISTORY  Chief Complaint Abdominal Pain    HPI Yolanda Reed is a 45 y.o. female brought to the ED via EMS from home with a chief complaint of lower abdominal pain, nausea and vomiting x3 days.  Reports vaginal discharge with odor.  Complains of chills at home.  Recently broke up with her boyfriend and had unprotected sexual intercourse with another partner; concern for STDs.  Denies cough, chest pain, shortness of breath, diarrhea.  Started her period several days ago.      Past Medical History:  Diagnosis Date   Bipolar disorder ([place]) 09/20/2015   Gestational diabetes 10/19/1992   Hepatitis C    PTSD (post-traumatic stress disorder) 09/20/2015    Patient Active Problem List   Diagnosis Date Noted   Pelvic inflammatory disease (PID) 11/07/2020   Syphilis 07/30/2019   Tobacco abuse 07/21/2019   Chronic viral hepatitis C ([place]) 08/12/2018   Polysubstance abuse ([place]) 04/30/2007   History of attempted suicide 04/30/2007   Herpes simplex disease 08/26/1994    Past Surgical History:  Procedure Laterality Date   Council    Prior to Admission medications   Medication Sig Start Date End Date Taking? Authorizing Provider  HYDROcodone-acetaminophen (NORCO) 5-325 MG tablet Take 1 tablet by mouth every 6 (six) hours as needed for moderate pain. 05/25/20   Duanne Guess, PA-C    Allergies Morphine and related  Family History  Problem Relation Age of Onset   Diabetes Mother    Heart disease Mother    Hypertension Brother    Hyperlipidemia Brother    Kidney disease Maternal Grandmother    Diabetes Maternal Grandfather    Hypertension Paternal Grandmother      Social History Social History   Tobacco Use   Smoking status: Every Day    Packs/day: 0.50    Years: 31.00    Pack years: 15.50    Types: Cigarettes   Smokeless tobacco: Never  Substance Use Topics   Alcohol use: No   Drug use: Yes    Types: Marijuana    Review of Systems  Constitutional: No fever/chills Eyes: No visual changes. ENT: No sore throat. Cardiovascular: Denies chest pain. Respiratory: Denies shortness of breath. Gastrointestinal: Positive for lower abdominal pain, nausea and vomiting.  No diarrhea.  No constipation. Genitourinary: Negative for dysuria. Musculoskeletal: Negative for back pain. Skin: Negative for rash. Neurological: Negative for headaches, focal weakness or numbness.   ____________________________________________   PHYSICAL EXAM:  VITAL SIGNS: ED Triage Vitals  Enc Vitals Group     BP 11/06/20 2202 (!) 128/102     Pulse Rate 11/06/20 2202 (!) 109     Resp 11/06/20 2202 18     Temp 11/06/20 2202 (!) 100.6 F (38.1 C)     Temp Source 11/06/20 2202 Oral     SpO2 11/06/20 2142 95 %     Weight 11/06/20 2158 133 lb (60.3 kg)     Height 11/06/20 2158 5\' 3"  (1.6 m)     Head Circumference --      Peak Flow --      Pain Score 11/06/20 2158 8     Pain Loc --  Pain Edu? --      Excl. in GC? --     Constitutional: Alert and oriented. Well appearing and in mild acute distress. Eyes: Conjunctivae are normal. PERRL. EOMI. Head: Atraumatic. Nose: No congestion/rhinnorhea. Mouth/Throat: Mucous membranes are moist.   Neck: No stridor.  Supple neck without meningismus. Cardiovascular: Tachycardic rate, regular rhythm. Grossly normal heart sounds.  Good peripheral circulation. Respiratory: Normal respiratory effort.  No retractions. Lungs CTAB. Gastrointestinal: Soft with diffuse mild tenderness to palpation, maximally midline lower abdomen without rebound or guarding. No distention. No abdominal bruits. No CVA  tenderness. Musculoskeletal: No lower extremity tenderness nor edema.  No joint effusions. Neurologic:  Normal speech and language. No gross focal neurologic deficits are appreciated. No gait instability. Skin:  Skin is warm, dry and intact. No rash noted.  No petechiae. Psychiatric: Mood and affect are normal. Speech and behavior are normal.  ____________________________________________   LABS (all labs ordered are listed, but only abnormal results are displayed)  Labs Reviewed  CHLAMYDIA/NGC RT PCR ([facility] ONLY)           - Abnormal; Notable for the following components:      Result Value   N gonorrhoeae DETECTED (*)    All other components within normal limits  WET PREP, GENITAL - Abnormal; Notable for the following components:   Clue Cells Wet Prep HPF POC PRESENT (*)    WBC, Wet Prep HPF POC MANY (*)    All other components within normal limits  COMPREHENSIVE METABOLIC PANEL - Abnormal; Notable for the following components:   Glucose, Bld 133 (*)    Total Protein 8.3 (*)    All other components within normal limits  CBC - Abnormal; Notable for the following components:   WBC 20.8 (*)    Hemoglobin 15.9 (*)    HCT 47.1 (*)    All other components within normal limits  URINALYSIS, ROUTINE W REFLEX MICROSCOPIC - Abnormal; Notable for the following components:   Hgb urine dipstick LARGE (*)    Bilirubin Urine SMALL (*)    Ketones, ur >160 (*)    Protein, ur 30 (*)    Leukocytes,Ua SMALL (*)    All other components within normal limits  CBC WITH DIFFERENTIAL/PLATELET - Abnormal; Notable for the following components:   WBC 20.7 (*)    RBC 5.28 (*)    Hemoglobin 15.8 (*)    HCT 49.1 (*)    Platelets 407 (*)    Neutro Abs 17.8 (*)    Abs Immature Granulocytes 0.12 (*)    All other components within normal limits  RESP PANEL BY RT-PCR (FLU A&B, COVID) ARPGX2  CULTURE, BLOOD (ROUTINE X 2)  CULTURE, BLOOD (ROUTINE X 2)  URINE CULTURE  LIPASE, BLOOD  URINALYSIS, MICROSCOPIC  (REFLEX)  LACTIC ACID, PLASMA  POC URINE PREG, ED   ____________________________________________  EKG  None ____________________________________________  RADIOLOGY I, Kilo Eshelman J, personally viewed and evaluated these images (plain radiographs) as part of my medical decision making, as well as reviewing the written report by the radiologist.  ED MD interpretation: CT unremarkable  Official radiology report(s): CT Abdomen Pelvis W Contrast  Result Date: 11/07/2020 CLINICAL DATA:  Abdominal pain and fever EXAM: CT ABDOMEN AND PELVIS WITH CONTRAST TECHNIQUE: Multidetector CT imaging of the abdomen and pelvis was performed using the standard protocol following bolus administration of intravenous contrast. CONTRAST:  OMNIPAQUE IOHEXOL 300 MG/ML  SOLN COMPARISON:  06/13/2019 FINDINGS: LOWER CHEST: Normal. HEPATOBILIARY: Normal hepatic contours. No intra- or  extrahepatic biliary dilatation. Normal gallbladder. PANCREAS: Normal pancreas. No ductal dilatation or peripancreatic fluid collection. SPLEEN: Normal. ADRENALS/URINARY TRACT: Unchanged 10 mm left adrenal adenoma. No hydronephrosis, nephroureterolithiasis or solid renal mass. The urinary bladder is normal for degree of distention STOMACH/BOWEL: There is no hiatal hernia. Normal duodenal course and caliber. No small bowel dilatation or inflammation. No focal colonic abnormality. Status post appendectomy. VASCULAR/LYMPHATIC: Normal course and caliber of the major abdominal vessels. No abdominal or pelvic lymphadenopathy. REPRODUCTIVE: Normal uterus. No adnexal mass. MUSCULOSKELETAL. No bony spinal canal stenosis or focal osseous abnormality. OTHER: None. IMPRESSION: 1. No acute abdominal or pelvic abnormality. 2. Unchanged left adrenal adenoma. Electronically Signed   By: Ulyses Jarred M.D.   On: 11/07/2020 03:31    Pelvic ultrasound interpreted per Dr. Vanita Panda: 1. Right fallopian tube distended with fluid with debris in the fluid. Findings  could be due to a hydrosalpinx or pyosalpinx. 2. Questionable left hydrosalpinx but not as well seen. 3. Questionable 2.3 cm left paraovarian cyst. 4. No endometrial thickening, with scattered calcifications at the endometrial periphery. 5. Consider CT follow-up for further evaluation, preferably with contrast.  ____________________________________________   PROCEDURES  Procedure(s) performed (including Critical Care):  Procedures  Pelvic exam: External exam unremarkable without rashes, lesions or vesicles.  Speculum exam reveals mild vaginal bleeding, mild purulence.  Cervical os is closed.  Cervical tissue appears friable.  Bimanual exam reveals mild midline tenderness.  ____________________________________________   INITIAL IMPRESSION / ASSESSMENT AND PLAN / ED COURSE  As part of my medical decision making, I reviewed the following data within the Central notes reviewed and incorporated, Labs reviewed, Old chart reviewed, Radiograph reviewed, and Notes from prior ED visits     45 year old female presenting with lower abdominal/pelvic pain. Differential diagnosis includes, but is not limited to, ovarian cyst, ovarian torsion, acute appendicitis, diverticulitis, urinary tract infection/pyelonephritis, endometriosis, bowel obstruction, colitis, renal colic, gastroenteritis, hernia, fibroids, endometriosis, pregnancy related pain including ectopic pregnancy, etc.   Laboratory results remarkable for leukocytosis, ketonuria.  STI swabs obtained, will obtain both pelvic ultrasound as well as CT abdomen/pelvis.  Tylenol administered for fever.  Will initiate IV fluid resuscitation, broad-spectrum IV antibiotics to cover PID.  Will reassess.    ----------------------------------------- 4:11 AM on 11/07/2020 -----------------------------------------   Labs still spinning GC/chlamydia.  Updated patient on all test results.  Will discuss with GYN  on-call.  ----------------------------------------- 4:33 AM on 11/07/2020 -----------------------------------------   Discussed case with Dr. Leafy Ro who will evaluate patient in the ED for admission. ____________________________________________   FINAL CLINICAL IMPRESSION(S) / ED DIAGNOSES  Final diagnoses:  Pain  Fever, unspecified fever cause  Pelvic pain in female  Nausea and vomiting, unspecified vomiting type  Dehydration  Bacterial vaginosis  Lower urinary tract infectious disease  PID (acute pelvic inflammatory disease)  Pyosalpinx  Gonorrhea     ED Discharge Orders     None        Note:  This document was prepared using Dragon voice recognition software and may include unintentional dictation errors.    Paulette Blanch, MD 11/07/20 [id number]    Paulette Blanch, MD 11/07/20 [phone]

## 2020-11-07 NOTE — H&P (Signed)
Consult History and Physical   SERVICE: Gynecology  Patient Name: Yolanda Reed Patient MRN:   [id number]  CC: Bilateral lower abdominal pain  HPI: Yolanda Reed is a 45 y.o. [id number] presenting with bilateral lower abdominal pain.    Review of Systems: positives in bold GEN:   fevers, chills, weight changes, appetite changes, fatigue, night sweats HEENT:  HA, vision changes, hearing loss, congestion, rhinorrhea, sinus pressure, dysphagia CV:   CP, palpitations PULM:  SOB, cough GI:  abd pain, N/V/D/C GU:  dysuria, urgency, frequency MSK:  arthralgias, myalgias, back pain, swelling SKIN:  rashes, color changes, pallor NEURO:  numbness, weakness, tingling, seizures, dizziness, tremors PSYCH:  depression, anxiety, behavioral problems, confusion  HEME/LYMPH:  easy bruising or bleeding ENDO:  heat/cold intolerance  Past Obstetrical History: OB History     Gravida  3   Para  3   Term  3   Preterm  0   AB  0   Living  3      SAB  0   IAB  0   Ectopic  0   Multiple  0   Live Births  3           Past Gynecologic History: Patient's last menstrual period was 11/01/2020.   Past Medical History: Past Medical History:  Diagnosis Date   Bipolar disorder (HCC) 09/20/2015   Gestational diabetes 10/19/1992   Hepatitis C    PTSD (post-traumatic stress disorder) 09/20/2015    Past Surgical History:   Past Surgical History:  Procedure Laterality Date   APPENDECTOMY     CESAREAN SECTION  1996    Family History:  family history includes Diabetes in her maternal grandfather and mother; Heart disease in her mother; Hyperlipidemia in her brother; Hypertension in her brother and paternal grandmother; Kidney disease in her maternal grandmother.  Social History:  Social History   Socioeconomic History   Marital status: Legally Separated    Spouse name: Not on file   Number of children: 3   Years of education: 9   Highest education level: Not on file   Occupational History   Not on file  Tobacco Use   Smoking status: Every Day    Packs/day: 0.50    Years: 31.00    Pack years: 15.50    Types: Cigarettes   Smokeless tobacco: Never  Substance and Sexual Activity   Alcohol use: No   Drug use: Yes    Types: Marijuana   Sexual activity: Not Currently  Other Topics Concern   Not on file  Social History Narrative   Not on file   Social Determinants of Health   Financial Resource Strain: Not on file  Food Insecurity: Not on file  Transportation Needs: Not on file  Physical Activity: Not on file  Stress: Not on file  Social Connections: Not on file  Intimate Partner Violence: Not on file    Home Medications:  Medications reconciled in EPIC  No current facility-administered medications on file prior to encounter.   Current Outpatient Medications on File Prior to Encounter  Medication Sig Dispense Refill   HYDROcodone-acetaminophen (NORCO) 5-325 MG tablet Take 1 tablet by mouth every 6 (six) hours as needed for moderate pain. 10 tablet 0    Allergies:  Allergies  Allergen Reactions   Morphine And Related Hives    Physical Exam:  Temp:  [98.1 F (36.7 C)-100.6 F (38.1 C)] 98.1 F (36.7 C) (11/02 0447) Pulse Rate:  [65-109] 73 (11/02 0643)  Resp:  [16-18] 18 (11/02 0643) BP: (85-128)/(43-102) 99/54 (11/02 0643) SpO2:  [94 %-99 %] 99 % (11/02 0643) Weight:  [60.3 kg] 60.3 kg (11/01 2158)   General Appearance:  Well developed, well nourished, no acute distress, alert and oriented x3 HEENT:  Normocephalic atraumatic, extraocular movements intact, moist mucous membranes Cardiovascular:  Normal S1/S2, regular rate and rhythm, no murmurs Pulmonary:  clear to auscultation, no wheezes, rales or rhonchi, symmetric air entry, good air exchange Abdomen:  Bowel sounds present, soft, nondistended, no abnormal masses, no epigastric pain, difusely tender throughout, with +McBurneys, negative Murphy's sign, negative rovsing's  sign Extremities:  Full range of motion, no pedal edema, no tenderness Skin:  normal coloration and turgor, no rashes, no suspicious skin lesions noted  Neurologic:  Cranial nerves 2-12 grossly intact, normal muscle tone, strength 5/5 all four extremities Psychiatric:  Normal mood and affect, appropriate, no AH/VH Pelvic:  NEFG, no vulvar masses or lesions, normal vaginal mucosa, no vaginal bleeding or discharge, cervix without lesions or erythema, tender uterus, no adnexal masses appreciated,  no palpable nodularity on rectovaginal exam, no pelvic organ prolapse   Labs/Studies:   CBC and Coags:  Lab Results  Component Value Date   WBC 20.8 (H) 11/06/2020   WBC 20.7 (H) 11/06/2020   NEUTOPHILPCT 86 11/06/2020   EOSPCT 0 11/06/2020   BASOPCT 0 11/06/2020   LYMPHOPCT 9 11/06/2020   HGB 15.9 (H) 11/06/2020   HGB 15.8 (H) 11/06/2020   HCT 47.1 (H) 11/06/2020   HCT 49.1 (H) 11/06/2020   MCV 93.3 11/06/2020   MCV 93.0 11/06/2020   PLT 369 11/06/2020   PLT 407 (H) 11/06/2020   CMP:  Lab Results  Component Value Date   NA 136 11/06/2020   K 3.9 11/06/2020   CL 101 11/06/2020   CO2 26 11/06/2020   BUN 12 11/06/2020   CREATININE 0.73 11/06/2020   CREATININE 0.64 06/27/2020   CREATININE 0.49 06/13/2019   PROT 8.3 (H) 11/06/2020   BILITOT 1.0 11/06/2020   ALT 25 11/06/2020   AST 19 11/06/2020   ALKPHOS 91 11/06/2020    Other Imaging: CT Abdomen Pelvis W Contrast  Result Date: 11/07/2020 CLINICAL DATA:  Abdominal pain and fever EXAM: CT ABDOMEN AND PELVIS WITH CONTRAST TECHNIQUE: Multidetector CT imaging of the abdomen and pelvis was performed using the standard protocol following bolus administration of intravenous contrast. CONTRAST:  OMNIPAQUE IOHEXOL 300 MG/ML  SOLN COMPARISON:  06/13/2019 FINDINGS: LOWER CHEST: Normal. HEPATOBILIARY: Normal hepatic contours. No intra- or extrahepatic biliary dilatation. Normal gallbladder. PANCREAS: Normal pancreas. No ductal dilatation  or peripancreatic fluid collection. SPLEEN: Normal. ADRENALS/URINARY TRACT: Unchanged 10 mm left adrenal adenoma. No hydronephrosis, nephroureterolithiasis or solid renal mass. The urinary bladder is normal for degree of distention STOMACH/BOWEL: There is no hiatal hernia. Normal duodenal course and caliber. No small bowel dilatation or inflammation. No focal colonic abnormality. Status post appendectomy. VASCULAR/LYMPHATIC: Normal course and caliber of the major abdominal vessels. No abdominal or pelvic lymphadenopathy. REPRODUCTIVE: Normal uterus. No adnexal mass. MUSCULOSKELETAL. No bony spinal canal stenosis or focal osseous abnormality. OTHER: None. IMPRESSION: 1. No acute abdominal or pelvic abnormality. 2. Unchanged left adrenal adenoma. Electronically Signed   By: Deatra Robinson M.D.   On: 11/07/2020 03:31     FINDINGS: Uterus  Measurements: 8.7 x 4.8 x 5.9 = volume: 130 mL. No fibroids or other mass visualized.  Endometrium  Thickness: 8.7. No focal abnormality visualized apart from scattered punctate calcifications in the peripheral endometrium.  Right ovary  Measurements: 2.8 x 1.7 x 2.1 = volume: 5.3 mL. Normal appearance/no adnexal mass, but there is a tortuous, fluid and debris filled fallopian tube up to 1.4 cm in diameter noted.  Left ovary  Measurements: 4.7 x 2.6 x 4.5 = volume: 29.1 mL. Normal appearance/no adnexal mass, but question is raised of fluid filling and distention of the fallopian tube on this side as well although not as well seen due to positioning of structures. There are also be a 2.3 cm paraovarian cyst which is also not well seen.  Pulsed Doppler evaluation of both ovaries demonstrates normal low-resistance arterial and venous waveforms.  Other findings  No abnormal free fluid. Unremarkable visualized bladder.  IMPRESSION: 1. Right fallopian tube distended with fluid with debris in the fluid. Findings could be due to a hydrosalpinx or  pyosalpinx. 2. Questionable left hydrosalpinx but not as well seen. 3. Questionable 2.3 cm left paraovarian cyst. 4. No endometrial thickening, with scattered calcifications at the endometrial periphery. 5. Consider CT follow-up for further evaluation, preferably with contrast.   Assessment / Plan:   Danette L Gorey is a 45 y.o. [id number] who presents with sx of PID.  1. 1.  Pt meets hospital admission criteria based on level of pain, fever, rigors and presence of mucopurulent discharge along with radiologic findings.  - Per Uptodate guidelines: plan to use cefoxitin 2 g IV Q6hrs and doxycyline 100mg   IV Q12hrs; plan to transition to PO Doxycycline 100mg  PO BID x 14d once improving. Flagyl is added due to findings of BV.  -  Will provide Norco PO for moderate pain, for pain management.  - Anti-emetic, sleep medication and IV Fluids ordered - Will consider consultation to infectious dz if condition does not improve with IV antibiotics.  - f/u blood cultures, urine culture. GC/CT and wet prep Neg.   - Regular diet - ambulation as tolerated   Thank you for the opportunity to be involved with this pt's care.

## 2020-11-08 LAB — CBC WITH DIFFERENTIAL/PLATELET
Abs Immature Granulocytes: 0.05 10*3/uL (ref 0.00–0.07)
Basophils Absolute: 0.1 10*3/uL (ref 0.0–0.1)
Basophils Relative: 0 %
Eosinophils Absolute: 0 10*3/uL (ref 0.0–0.5)
Eosinophils Relative: 0 %
HCT: 37.3 % (ref 36.0–46.0)
Hemoglobin: 12.1 g/dL (ref 12.0–15.0)
Immature Granulocytes: 0 %
Lymphocytes Relative: 11 %
Lymphs Abs: 1.4 10*3/uL (ref 0.7–4.0)
MCH: 30.1 pg (ref 26.0–34.0)
MCHC: 32.4 g/dL (ref 30.0–36.0)
MCV: 92.8 fL (ref 80.0–100.0)
Monocytes Absolute: 1.3 10*3/uL — ABNORMAL HIGH (ref 0.1–1.0)
Monocytes Relative: 10 %
Neutro Abs: 10 10*3/uL — ABNORMAL HIGH (ref 1.7–7.7)
Neutrophils Relative %: 79 %
Platelets: 307 10*3/uL (ref 150–400)
RBC: 4.02 MIL/uL (ref 3.87–5.11)
RDW: 13.1 % (ref 11.5–15.5)
WBC: 12.9 10*3/uL — ABNORMAL HIGH (ref 4.0–10.5)
nRBC: 0 % (ref 0.0–0.2)

## 2020-11-08 LAB — RPR: RPR Ser Ql: NONREACTIVE

## 2020-11-08 MED ORDER — ONDANSETRON HCL 4 MG/2ML IJ SOLN
4.0000 mg | Freq: Four times a day (QID) | INTRAMUSCULAR | Status: DC | PRN
  Administered 2020-11-08 – 2020-11-09 (×2): 4 mg via INTRAVENOUS
  Filled 2020-11-08: qty 2

## 2020-11-08 MED ORDER — CALCIUM CARBONATE ANTACID 500 MG PO CHEW
400.0000 mg | CHEWABLE_TABLET | Freq: Three times a day (TID) | ORAL | Status: DC | PRN
  Administered 2020-11-08 – 2020-11-09 (×2): 400 mg via ORAL
  Filled 2020-11-08 (×2): qty 2

## 2020-11-08 MED ORDER — NICOTINE 21 MG/24HR TD PT24
21.0000 mg | MEDICATED_PATCH | Freq: Every day | TRANSDERMAL | Status: DC
  Administered 2020-11-08 – 2020-11-09 (×2): 21 mg via TRANSDERMAL
  Filled 2020-11-08 (×2): qty 1

## 2020-11-08 NOTE — Progress Notes (Signed)
Obstetric and Gynecology  Subjective  Yolanda Reed is a 45 y.o. female G3P3003 who presented on 11/07/2020 for PID .      Objective   Vitals:   11/08/20 0835 11/08/20 1144  BP: 108/64 (!) 99/58  Pulse: 91 80  Resp: 20 18  Temp: 99 F (37.2 C) 98.9 F (37.2 C)  SpO2: 98% 99%     Intake/Output Summary (Last 24 hours) at 11/08/2020 1323 Last data filed at 11/08/2020 1031 Gross per 24 hour  Intake 586.93 ml  Output 1200 ml  Net -613.07 ml    General: NAD Cardiovascular: RRR, no murmurs Pulmonary: CTAB Abdomen: Benign. Non-tender, +BS, no guarding. Extremities: No erythema or cords, no calf tenderness, +warmth with normal peripheral pulses.  Labs: Results for orders placed or performed during the hospital encounter of 11/07/20 (from the past 24 hour(s))  RPR     Status: None   Collection Time: 11/07/20  3:08 PM  Result Value Ref Range   RPR Ser Ql NON REACTIVE NON REACTIVE  HIV Antibody (routine testing w rflx)     Status: None   Collection Time: 11/07/20  3:08 PM  Result Value Ref Range   HIV Screen 4th Generation wRfx Non Reactive Non Reactive  .Hepatitis B Surface Antigen     Status: None   Collection Time: 11/07/20  3:08 PM  Result Value Ref Range   Hepatitis B Surface Ag NON REACTIVE NON REACTIVE  Urine Drug Screen, Qualitative ([facility] only)     Status: Abnormal   Collection Time: 11/07/20  5:28 PM  Result Value Ref Range   Tricyclic, Ur Screen NONE DETECTED NONE DETECTED   Amphetamines, Ur Screen NONE DETECTED NONE DETECTED   MDMA (Ecstasy)Ur Screen NONE DETECTED NONE DETECTED   Cocaine Metabolite,Ur [place] POSITIVE (A) NONE DETECTED   Opiate, Ur Screen POSITIVE (A) NONE DETECTED   Phencyclidine (PCP) Ur S NONE DETECTED NONE DETECTED   Cannabinoid 50 Ng, Ur [place] POSITIVE (A) NONE DETECTED   Barbiturates, Ur Screen NONE DETECTED NONE DETECTED   Benzodiazepine, Ur Scrn NONE DETECTED NONE DETECTED   Methadone Scn, Ur NONE DETECTED NONE DETECTED  CBC with  Differential/Platelet     Status: Abnormal   Collection Time: 11/08/20  5:54 AM  Result Value Ref Range   WBC 12.9 (H) 4.0 - 10.5 K/uL   RBC 4.02 3.87 - 5.11 MIL/uL   Hemoglobin 12.1 12.0 - 15.0 g/dL   HCT 01.0 07.1 - 21.9 %   MCV 92.8 80.0 - 100.0 fL   MCH 30.1 26.0 - 34.0 pg   MCHC 32.4 30.0 - 36.0 g/dL   RDW 75.8 83.2 - 54.9 %   Platelets 307 150 - 400 K/uL   nRBC 0.0 0.0 - 0.2 %   Neutrophils Relative % 79 %   Neutro Abs 10.0 (H) 1.7 - 7.7 K/uL   Lymphocytes Relative 11 %   Lymphs Abs 1.4 0.7 - 4.0 K/uL   Monocytes Relative 10 %   Monocytes Absolute 1.3 (H) 0.1 - 1.0 K/uL   Eosinophils Relative 0 %   Eosinophils Absolute 0.0 0.0 - 0.5 K/uL   Basophils Relative 0 %   Basophils Absolute 0.1 0.0 - 0.1 K/uL   Immature Granulocytes 0 %   Abs Immature Granulocytes 0.05 0.00 - 0.07 K/uL    Cultures: Results for orders placed or performed during the hospital encounter of 11/07/20  Urine Culture     Status: None   Collection Time: 11/06/20 10:00 PM  Specimen: Urine, Random  Result Value Ref Range Status   Specimen Description   Final    URINE, RANDOM Performed at [facility], [address] [place], [place] [address]    Special Requests   Final    NONE Performed at [facility], [address], [place], [place] [address]    Culture   Final    NO GROWTH Performed at [facility], [address] [place], [place] [address]    Report Status 11/07/2020 FINAL  Final  Chlamydia/NGC rt PCR ([facility] only)     Status: Abnormal   Collection Time: 11/07/20  1:05 AM   Specimen: Cervical/Vaginal swab  Result Value Ref Range Status   Specimen source GC/Chlam VAG  Final   Chlamydia Tr NOT DETECTED NOT DETECTED Final   N gonorrhoeae DETECTED (A) NOT DETECTED Final    Comment: (NOTE) This CT/NG assay has not been evaluated in patients with a history of  hysterectomy. Performed at [facility], [address] [place], [place] [address]   Wet  prep, genital     Status: Abnormal   Collection Time: 11/07/20  1:05 AM   Specimen: Cervical/Vaginal swab  Result Value Ref Range Status   Yeast Wet Prep HPF POC NONE SEEN NONE SEEN Final   Trich, Wet Prep NONE SEEN NONE SEEN Final   Clue Cells Wet Prep HPF POC PRESENT (A) NONE SEEN Final   WBC, Wet Prep HPF POC MANY (A) NONE SEEN Final   Sperm NONE SEEN  Final    Comment: Performed at [facility], [address] [place], [place] [address]  Culture, blood (routine x 2)     Status: None (Preliminary result)   Collection Time: 11/07/20  1:10 AM   Specimen: BLOOD  Result Value Ref Range Status   Specimen Description BLOOD RIGHT FOREARM  Final   Special Requests   Final    BOTTLES DRAWN AEROBIC AND ANAEROBIC Blood Culture results may not be optimal due to an excessive volume of blood received in culture bottles   Culture   Final    NO GROWTH 1 DAY Performed at [facility], [address] [place], [place] [address]    Report Status PENDING  Incomplete  Culture, blood (routine x 2)     Status: None (Preliminary result)   Collection Time: 11/07/20  1:10 AM   Specimen: BLOOD  Result Value Ref Range Status   Specimen Description BLOOD LEFT FOREARM  Final   Special Requests   Final    BOTTLES DRAWN AEROBIC AND ANAEROBIC Blood Culture adequate volume   Culture   Final    NO GROWTH 1 DAY Performed at [facility], [address], [place], [place] [address]    Report Status PENDING  Incomplete  Resp Panel by RT-PCR (Flu A&B, Covid) Nasopharyngeal Swab     Status: None   Collection Time: 11/07/20  1:10 AM   Specimen: Nasopharyngeal Swab; Nasopharyngeal(NP) swabs in vial transport medium  Result Value Ref Range Status   SARS Coronavirus 2 by RT PCR NEGATIVE NEGATIVE Final    Comment: (NOTE) SARS-CoV-2 target nucleic acids are NOT DETECTED.  The SARS-CoV-2 RNA is generally detectable in upper respiratory specimens during the acute phase of infection. The  lowest concentration of SARS-CoV-2 viral copies this assay can detect is 138 copies/mL. A negative result does not preclude SARS-Cov-2 infection and should not be used as the sole basis for treatment or other patient management  decisions. A negative result may occur with  improper specimen collection/handling, submission of specimen other than nasopharyngeal swab, presence of viral mutation(s) within the areas targeted by this assay, and inadequate number of viral copies(<138 copies/mL). A negative result must be combined with clinical observations, patient history, and epidemiological information. The expected result is Negative.  Fact Sheet for Patients:  BloggerCourse.com  Fact Sheet for Healthcare Providers:  SeriousBroker.it  This test is no t yet approved or cleared by the [place] FDA and  has been authorized for detection and/or diagnosis of SARS-CoV-2 by FDA under an Emergency Use Authorization (EUA). This EUA will remain  in effect (meaning this test can be used) for the duration of the COVID-19 declaration under Section 564(b)(1) of the Act, 21 U.S.C.section 360bbb-3(b)(1), unless the authorization is terminated  or revoked sooner.       Influenza A by PCR NEGATIVE NEGATIVE Final   Influenza B by PCR NEGATIVE NEGATIVE Final    Comment: (NOTE) The Xpert Xpress SARS-CoV-2/FLU/RSV plus assay is intended as an aid in the diagnosis of influenza from Nasopharyngeal swab specimens and should not be used as a sole basis for treatment. Nasal washings and aspirates are unacceptable for Xpert Xpress SARS-CoV-2/FLU/RSV testing.  Fact Sheet for Patients: BloggerCourse.com  Fact Sheet for Healthcare Providers: SeriousBroker.it  This test is not yet approved or cleared by the [place] FDA and has been authorized for detection and/or diagnosis of SARS-CoV-2 by FDA under  an Emergency Use Authorization (EUA). This EUA will remain in effect (meaning this test can be used) for the duration of the COVID-19 declaration under Section 564(b)(1) of the Act, 21 U.S.C. section 360bbb-3(b)(1), unless the authorization is terminated or revoked.  Performed at [facility], [address] [place], [place] [address]     Imaging: CT Abdomen Pelvis W Contrast  Result Date: 11/07/2020 CLINICAL DATA:  Abdominal pain and fever EXAM: CT ABDOMEN AND PELVIS WITH CONTRAST TECHNIQUE: Multidetector CT imaging of the abdomen and pelvis was performed using the standard protocol following bolus administration of intravenous contrast. CONTRAST:  OMNIPAQUE IOHEXOL 300 MG/ML  SOLN COMPARISON:  06/13/2019 FINDINGS: LOWER CHEST: Normal. HEPATOBILIARY: Normal hepatic contours. No intra- or extrahepatic biliary dilatation. Normal gallbladder. PANCREAS: Normal pancreas. No ductal dilatation or peripancreatic fluid collection. SPLEEN: Normal. ADRENALS/URINARY TRACT: Unchanged 10 mm left adrenal adenoma. No hydronephrosis, nephroureterolithiasis or solid renal mass. The urinary bladder is normal for degree of distention STOMACH/BOWEL: There is no hiatal hernia. Normal duodenal course and caliber. No small bowel dilatation or inflammation. No focal colonic abnormality. Status post appendectomy. VASCULAR/LYMPHATIC: Normal course and caliber of the major abdominal vessels. No abdominal or pelvic lymphadenopathy. REPRODUCTIVE: Normal uterus. No adnexal mass. MUSCULOSKELETAL. No bony spinal canal stenosis or focal osseous abnormality. OTHER: None. IMPRESSION: 1. No acute abdominal or pelvic abnormality. 2. Unchanged left adrenal adenoma. Electronically Signed   By: Deatra Robinson M.D.   On: 11/07/2020 03:31   US PELVIC COMPLETE W TRANSVAGINAL AND TORSION R/O  Result Date: 11/07/2020 CLINICAL DATA:  Lower abdominal pain for 3 days, concerning for PID. EXAM: TRANSABDOMINAL AND TRANSVAGINAL  ULTRASOUND OF PELVIS DOPPLER ULTRASOUND OF OVARIES TECHNIQUE: Both transabdominal and transvaginal ultrasound examinations of the pelvis were performed. Transabdominal technique was performed for global imaging of the pelvis including uterus, ovaries, adnexal regions, and pelvic cul-de-sac. It was necessary to proceed with endovaginal exam following the transabdominal exam to visualize the ovaries. Color and duplex Doppler ultrasound was utilized to evaluate blood flow to the  ovaries. COMPARISON:  CT with IV contrast dated 06/13/2019 FINDINGS: Uterus Measurements: 8.7 x 4.8 x 5.9 = volume: 130 mL. No fibroids or other mass visualized. Endometrium Thickness: 8.7. No focal abnormality visualized apart from scattered punctate calcifications in the peripheral endometrium. Right ovary Measurements: 2.8 x 1.7 x 2.1 = volume: 5.3 mL. Normal appearance/no adnexal mass, but there is a tortuous, fluid and debris filled fallopian tube up to 1.4 cm in diameter noted. Left ovary Measurements: 4.7 x 2.6 x 4.5 = volume: 29.1 mL. Normal appearance/no adnexal mass, but question is raised of fluid filling and distention of the fallopian tube on this side as well although not as well seen due to positioning of structures. There are also be a 2.3 cm paraovarian cyst which is also not well seen. Pulsed Doppler evaluation of both ovaries demonstrates normal low-resistance arterial and venous waveforms. Other findings No abnormal free fluid.  Unremarkable visualized bladder. IMPRESSION: 1. Right fallopian tube distended with fluid with debris in the fluid. Findings could be due to a hydrosalpinx or pyosalpinx. 2. Questionable left hydrosalpinx but not as well seen. 3. Questionable 2.3 cm left paraovarian cyst. 4. No endometrial thickening, with scattered calcifications at the endometrial periphery. 5. Consider CT follow-up for further evaluation, preferably with contrast. Electronically Signed   By: Almira Bar M.D.   On: 11/07/2020  03:15     Assessment   45 y.o. [id number] Hospital Day: 2   Plan   1. Per Uptodate guidelines: plan to use cefoxitin 2 g IV Q6hrs and doxycyline 100mg   IV Q12hrs; plan to transition to PO Doxycycline 100mg  PO BID x 14d once improving. Flagyl is added due to findings of BV. IV access loss, unable to transition to po antibiotic due to severity of symptoms. 2. Will provide Norco PO for moderate pain, for pain management.  - Anti-emetic, sleep medication and IV Fluids ordered - Will consider consultation to infectious dz if condition does not improve with IV antibiotics.  - f/u blood cultures, urine culture. GC/CT and wet prep Neg.  - IV access gained this am will continue IV atb   - Regular diet - ambulation as tolerated    Chari Manning, CNM Certified Nurse Midwife [facility]

## 2020-11-08 NOTE — Progress Notes (Signed)
IV access re-established. Pt receptive to receiving IV medication per CNM recommendation. Plan to give 10:00 antibiotics and Flagyl per order- CNM notified. Pt states she is feeling hot and sweat-temp 99.0. Pt states pain has been controlled with PO meds. Ambulating independently to bathroom. Pt tolerating PO fluids and food.

## 2020-11-08 NOTE — Progress Notes (Signed)
Pt c/o pain at IV site- slight swelling noted. IV to right wrist dc'd. SL to left wrist also not flushing with no blood return and pain at IV site per pt. It was dc'd as well. Pt states "No more IV's, I've been stuck too many times" CNM notified and since pt has received other IV antibiotics and half of this one this shift (next IV med due at 10am) decision was made by CNM to leave IV out for now to discuss with oncoming MD of possibility to switch to po antibiotics. Pain controlled with po meds at this time. At last Percocet administration, pain was rated at 4/10 by pt. Pt notified of decision and possibilty of IV restart later in the morning if deemed necessary by MD. Pt agreeable to current plan.

## 2020-11-08 NOTE — TOC Initial Note (Signed)
Transition of Care [facility]) - Initial/Assessment Note    Patient Details  Name: Yolanda Reed MRN: [id number] Date of Birth: 1976/01/07  Transition of Care [facility] CM/SW Contact:    Hetty Ely, RN Phone Number: 11/08/2020, 12:37 PM  Clinical Narrative: TOCRN spoke with patient about positive urine drug screen. Patient says she's been clean for three years and just had a break up with her boyfriend which led to her using. Patient says they are back together now and she is ok . Patient is currently living with friend and states she feels safe living in the home. Patient denies need for SA services, however was receptive to the [employer] list. TOC  assessment complete.                    Patient Goals and CMS Choice        Expected Discharge Plan and Services                                                Prior Living Arrangements/Services                       Activities of Daily Living Home Assistive Devices/Equipment: None ADL Screening (condition at time of admission) Patient's cognitive ability adequate to safely complete daily activities?: Yes Is the patient deaf or have difficulty hearing?: No Does the patient have difficulty seeing, even when wearing glasses/contacts?: No Does the patient have difficulty concentrating, remembering, or making decisions?: No Patient able to express need for assistance with ADLs?: Yes Does the patient have difficulty dressing or bathing?: Yes Independently performs ADLs?: Yes (appropriate for developmental age) Does the patient have difficulty walking or climbing stairs?: No Weakness of Legs: None Weakness of Arms/Hands: None  Permission Sought/Granted                  Emotional Assessment              Admission diagnosis:  Dehydration [E86.0] Lower urinary tract infectious disease [N39.0] Pyosalpinx [N70.93] Gonorrhea [A54.9] Pelvic inflammatory disease (PID) [N73.9] Pain [R52] PID (acute  pelvic inflammatory disease) [N73.0] Bacterial vaginosis [N76.0, B96.89] Pelvic pain in female [R10.2] Lower abdominal pain [R10.30] Fever, unspecified fever cause [R50.9] Nausea and vomiting, unspecified vomiting type [R11.2] Patient Active Problem List   Diagnosis Date Noted   Pelvic inflammatory disease (PID) 11/07/2020   Syphilis 07/30/2019   Tobacco abuse 07/21/2019   Chronic viral hepatitis C (HCC) 08/12/2018   Polysubstance abuse (HCC) 04/30/2007   History of attempted suicide 04/30/2007   Herpes simplex disease 08/26/1994   PCP:  Patient, No Pcp Per (Inactive) Pharmacy:   [facility] [address] ([address]), [place] - [address] [place] ([place]) [place] [address] Phone: [phone] Fax: [phone]  [facility] [address] [place] - [address] [place] [address] Phone: [phone] Fax: [phone]     Social Determinants of Health (SDOH) Interventions    Readmission Risk Interventions No flowsheet data found.

## 2020-11-09 ENCOUNTER — Other Ambulatory Visit: Payer: Self-pay

## 2020-11-09 MED ORDER — DOXYCYCLINE HYCLATE 100 MG PO TABS: 100.0000 mg | ORAL_TABLET | Freq: Two times a day (BID) | ORAL | 0 refills | Status: DC

## 2020-11-09 MED ORDER — ACETAMINOPHEN 500 MG PO TABS: 1000.0000 mg | ORAL_TABLET | Freq: Four times a day (QID) | ORAL | 0 refills | Status: DC | PRN

## 2020-11-09 MED ORDER — METRONIDAZOLE 500 MG PO TABS
500.0000 mg | ORAL_TABLET | Freq: Two times a day (BID) | ORAL | 0 refills | Status: AC
Start: 2020-11-09 — End: 2020-11-23
  Filled 2020-11-09 (×2): qty 28, 14d supply, fill #0

## 2020-11-09 MED ORDER — ACETAMINOPHEN 500 MG PO TABS
1000.0000 mg | ORAL_TABLET | Freq: Four times a day (QID) | ORAL | 0 refills | Status: AC | PRN
  Filled 2020-11-09: qty 30, 4d supply, fill #0

## 2020-11-09 MED ORDER — METRONIDAZOLE 500 MG PO TABS: 500.0000 mg | ORAL_TABLET | Freq: Two times a day (BID) | ORAL | 0 refills | Status: DC

## 2020-11-09 MED ORDER — DOXYCYCLINE HYCLATE 100 MG PO TABS
100.0000 mg | ORAL_TABLET | Freq: Two times a day (BID) | ORAL | Status: DC
  Filled 2020-11-09: qty 1

## 2020-11-09 MED ORDER — IBUPROFEN 600 MG PO TABS: 600.0000 mg | ORAL_TABLET | Freq: Four times a day (QID) | ORAL | 0 refills | Status: AC | PRN

## 2020-11-09 MED ORDER — OXYCODONE HCL 5 MG PO CAPS: 5.0000 mg | ORAL_CAPSULE | Freq: Four times a day (QID) | ORAL | 0 refills | Status: AC | PRN

## 2020-11-09 MED ORDER — METRONIDAZOLE 500 MG PO TABS
500.0000 mg | ORAL_TABLET | Freq: Two times a day (BID) | ORAL | Status: DC
  Filled 2020-11-09: qty 1

## 2020-11-09 MED ORDER — DOXYCYCLINE HYCLATE 100 MG PO TABS
100.0000 mg | ORAL_TABLET | Freq: Two times a day (BID) | ORAL | 0 refills | Status: AC
  Filled 2020-11-09 (×2): qty 28, 14d supply, fill #0

## 2020-11-09 MED ORDER — CALCIUM CARBONATE ANTACID 500 MG PO CHEW: 400.0000 mg | CHEWABLE_TABLET | Freq: Three times a day (TID) | ORAL | Status: AC | PRN

## 2020-11-09 NOTE — Discharge Summary (Signed)
GynecologicalDischarge Summary  Patient Name: Yolanda Reed DOB: [birth year] MRN: [id number]  Date of Admission: 11/07/2020 Date of Discharge: 11/09/2020  Hospital course:   The patient was admitted on 11/07/20 for severe abdominal/pelvic pain, N/V x 3days. Noted to have + gonorrhea and bacterial vaginosis on admit, positive urine drug screen for Cocaine, MJ and opiates.  Pt was seen by TOC, and is s/p IV antibiotics for 48hours. Today she was transitioned to PO antibiotics, TOC was consulted for assistance with medications. Pt's pain was well managed with occasional tylenol and percocet use, but overall pain is significantly improved.  - reviewed need for continued Abx Doxy and Flagyl BID x 14days.    She was given specific instructions and numbers to call in written and verbal format. She verbalized understanding, agrees with the plan of care, and all questions answered to her satisfaction.  Discharge Physical Exam:  BP 106/64 (BP Location: Right Arm)   Pulse 76   Temp 98.2 F (36.8 C) (Oral)   Resp 17   Ht 5\' 3"  (1.6 m)   Wt 60.3 kg   LMP 11/01/2020   SpO2 98%   BMI 23.56 kg/m   General: NAD CV: RRR Pulm: CTABL, nl effort ABD: s/nd/nt DVT Evaluation: LE non-ttp, no evidence of DVT on exam.  Hemoglobin  Date Value Ref Range Status  11/08/2020 12.1 12.0 - 15.0 g/dL Final   HGB  Date Value Ref Range Status  01/15/2013 15.0 12.0 - 16.0 g/dL Final   HCT  Date Value Ref Range Status  11/08/2020 37.3 36.0 - 46.0 % Final  01/15/2013 44.1 35.0 - 47.0 % Final      Plan:  Yolanda Reed was discharged to home in good condition. Follow-up appointment at [facility] in 2 weeks   Discharge Medications: Allergies as of 11/09/2020       Reactions   Morphine And Related Hives        Medication List     STOP taking these medications    HYDROcodone-acetaminophen 5-325 MG tablet Commonly known as: Norco       TAKE these medications    acetaminophen 500  MG tablet Commonly known as: TYLENOL Take 2 tablets (1,000 mg total) by mouth every 6 (six) hours as needed for mild pain (temp >100.4 F).   calcium carbonate 500 MG chewable tablet Commonly known as: TUMS - dosed in mg elemental calcium Chew 2 tablets (400 mg of elemental calcium total) by mouth 3 (three) times daily as needed for indigestion or heartburn.   doxycycline 100 MG tablet Commonly known as: VIBRA-TABS Take 1 tablet (100 mg total) by mouth every 12 (twelve) hours for 14 days.   ibuprofen 600 MG tablet Commonly known as: ADVIL Take 1 tablet (600 mg total) by mouth every 6 (six) hours as needed.   metroNIDAZOLE 500 MG tablet Commonly known as: FLAGYL Take 1 tablet (500 mg total) by mouth every 12 (twelve) hours for 14 days.   oxycodone 5 MG capsule Commonly known as: OXY-IR Take 1 capsule (5 mg total) by mouth every 6 (six) hours as needed for up to 3 days for pain.          Signed: 13/04/2020, CNM 12:47 PM

## 2020-11-09 NOTE — Progress Notes (Signed)
Discharge instructions reviewed with patient who verbalized understanding. Information given to patient to pick up medications for no cost.  Patient discharged home.

## 2020-11-12 LAB — CULTURE, BLOOD (ROUTINE X 2)
Culture: NO GROWTH
Culture: NO GROWTH
Special Requests: ADEQUATE

## 2021-02-11 ENCOUNTER — Other Ambulatory Visit: Payer: Self-pay

## 2021-03-20 ENCOUNTER — Other Ambulatory Visit: Payer: Self-pay

## 2021-06-19 ENCOUNTER — Other Ambulatory Visit (HOSPITAL_COMMUNITY): Payer: Self-pay

## 2021-08-31 ENCOUNTER — Other Ambulatory Visit: Payer: Self-pay

## 2021-08-31 DIAGNOSIS — L03211 Cellulitis of face: Secondary | ICD-10-CM | POA: Insufficient documentation

## 2021-08-31 NOTE — ED Triage Notes (Signed)
Abscess noted to L lower mouth with redness and redness to L nare. Pt reports onset 2 days ago. Pt reports concern for cellulitis and reports hx of same in umbilicus. Pt breathing unlabored speaking in full sentences. Pt maintaining secretions. Denies fevers. Reports nerve pain.

## 2021-09-01 ENCOUNTER — Emergency Department
Admission: EM | Admit: 2021-09-01 | Discharge: 2021-09-01 | Disposition: A | Discharge status: Home / Self Care | Payer: Self-pay | Attending: Emergency Medicine | Admitting: Emergency Medicine

## 2021-09-01 ENCOUNTER — Emergency Department: Payer: Self-pay

## 2021-09-01 DIAGNOSIS — L03211 Cellulitis of face: Secondary | ICD-10-CM

## 2021-09-01 LAB — CBC WITH DIFFERENTIAL/PLATELET
Abs Immature Granulocytes: 0.02 10*3/uL (ref 0.00–0.07)
Basophils Absolute: 0.1 10*3/uL (ref 0.0–0.1)
Basophils Relative: 1 %
Eosinophils Absolute: 0.2 10*3/uL (ref 0.0–0.5)
Eosinophils Relative: 2 %
HCT: 43.9 % (ref 36.0–46.0)
Hemoglobin: 13.9 g/dL (ref 12.0–15.0)
Immature Granulocytes: 0 %
Lymphocytes Relative: 32 %
Lymphs Abs: 3.2 10*3/uL (ref 0.7–4.0)
MCH: 28.8 pg (ref 26.0–34.0)
MCHC: 31.7 g/dL (ref 30.0–36.0)
MCV: 91.1 fL (ref 80.0–100.0)
Monocytes Absolute: 0.8 10*3/uL (ref 0.1–1.0)
Monocytes Relative: 7 %
Neutro Abs: 6 10*3/uL (ref 1.7–7.7)
Neutrophils Relative %: 58 %
Platelets: 361 10*3/uL (ref 150–400)
RBC: 4.82 MIL/uL (ref 3.87–5.11)
RDW: 14.2 % (ref 11.5–15.5)
WBC: 10.3 10*3/uL (ref 4.0–10.5)
nRBC: 0 % (ref 0.0–0.2)

## 2021-09-01 LAB — COMPREHENSIVE METABOLIC PANEL
ALT: 27 U/L (ref 0–44)
AST: 27 U/L (ref 15–41)
Albumin: 3.6 g/dL (ref 3.5–5.0)
Alkaline Phosphatase: 87 U/L (ref 38–126)
Anion gap: 9 (ref 5–15)
BUN: 15 mg/dL (ref 6–20)
CO2: 22 mmol/L (ref 22–32)
Calcium: 8.2 mg/dL — ABNORMAL LOW (ref 8.9–10.3)
Chloride: 108 mmol/L (ref 98–111)
Creatinine, Ser: 0.64 mg/dL (ref 0.44–1.00)
GFR, Estimated: >60 mL/min (ref >60)
Glucose, Bld: 122 mg/dL — ABNORMAL HIGH (ref 70–99)
Potassium: 3.8 mmol/L (ref 3.5–5.1)
Sodium: 139 mmol/L (ref 135–145)
Total Bilirubin: 0.5 mg/dL (ref 0.3–1.2)
Total Protein: 7 g/dL (ref 6.5–8.1)

## 2021-09-01 LAB — POC URINE PREG, ED: Preg Test, Ur: NEGATIVE

## 2021-09-01 LAB — LACTIC ACID, PLASMA: Lactic Acid, Venous: 2 mmol/L (ref 0.5–1.9)

## 2021-09-01 LAB — PROCALCITONIN: Procalcitonin: <0.1 ng/mL

## 2021-09-01 MED ORDER — IOHEXOL 300 MG/ML  SOLN
75.0000 mL | Freq: Once | INTRAMUSCULAR | Status: AC | PRN
  Administered 2021-09-01: 75 mL via INTRAVENOUS

## 2021-09-01 MED ORDER — SULFAMETHOXAZOLE-TRIMETHOPRIM 800-160 MG PO TABS
1.0000 | ORAL_TABLET | Freq: Once | ORAL | Status: AC
  Administered 2021-09-01: 1 via ORAL
  Filled 2021-09-01: qty 1

## 2021-09-01 MED ORDER — SULFAMETHOXAZOLE-TRIMETHOPRIM 800-160 MG PO TABS: 1.0000 | ORAL_TABLET | Freq: Two times a day (BID) | ORAL | 0 refills | Status: AC

## 2021-09-01 MED ORDER — LACTATED RINGERS IV BOLUS
1000.0000 mL | Freq: Once | INTRAVENOUS | Status: AC
  Administered 2021-09-01: 1000 mL via INTRAVENOUS

## 2021-09-01 MED ORDER — KETOROLAC TROMETHAMINE 30 MG/ML IJ SOLN
15.0000 mg | Freq: Once | INTRAMUSCULAR | Status: AC
Start: 2021-09-01 — End: 2021-09-01
  Administered 2021-09-01: 15 mg via INTRAVENOUS
  Filled 2021-09-01: qty 1

## 2021-09-01 NOTE — ED Provider Notes (Addendum)
[facility] Provider Note    Event Date/Time   First MD Initiated Contact with Patient 09/01/21 [phone]     (approximate)   History   Abscess   HPI  Yolanda Reed is a 46 y.o. female who presents to the ED for evaluation of Abscess   I reviewed obstetric admission from November because of PID.  History of polysubstance abuse, PTSD, bipolar disorder, hep C.  Patient presents to the ED for evaluation of 2 days of swelling to the left side of her face.  She reports a pimple or a spot popping up to the lower portion of her cheek just above the mandible 2 days ago, then the swelling spreading superiorly over her maxilla towards her ear, eye and temple.  No recent antibiotics.   Physical Exam   Triage Vital Signs: ED Triage Vitals  Enc Vitals Group     BP 08/31/21 2345 128/81     Pulse Rate 08/31/21 2345 88     Resp 08/31/21 2345 18     Temp 08/31/21 2345 99.3 F (37.4 C)     Temp Source 08/31/21 2345 Oral     SpO2 08/31/21 2345 99 %     Weight 08/31/21 2347 146 lb (66.2 kg)     Height 08/31/21 2347 5\' 4"  (1.626 m)     Head Circumference --      Peak Flow --      Pain Score 08/31/21 2346 9     Pain Loc --      Pain Edu? --      Excl. in GC? --     Most recent vital signs: Vitals:   08/31/21 2345 09/01/21 0300  BP: 128/81 (!) 133/93  Pulse: 88 83  Resp: 18 18  Temp: 99.3 F (37.4 C) 99.5 F (37.5 C)  SpO2: 99% 96%    General: Awake, no distress.  Looks systemically well. CV:  Good peripheral perfusion.  Resp:  Normal effort.  Abd:  No distention.  MSK:  No deformity noted.  Neuro:  No focal deficits appreciated. Other:  Small 1 cm raised area of erythema to left-sided cheek as well as some mild diffuse soft tissue swelling throughout the left-sided cheek and face.  No signs of EOM entrapment or trauma to the face.  No intraoral lesions.  No signs of periapical abscess.  Uvula is midline.  No signs of PTA.  No submandibular or cervical lymph  nodes noted.  No firmness or woodiness to the anterior midline neck and beneath the jaw     ED Results / Procedures / Treatments   Labs (all labs ordered are listed, but only abnormal results are displayed) Labs Reviewed  COMPREHENSIVE METABOLIC PANEL - Abnormal; Notable for the following components:      Result Value   Glucose, Bld 122 (*)    Calcium 8.2 (*)    All other components within normal limits  LACTIC ACID, PLASMA - Abnormal; Notable for the following components:   Lactic Acid, Venous 2.0 (*)    All other components within normal limits  CULTURE, BLOOD (ROUTINE X 2)  CULTURE, BLOOD (ROUTINE X 2)  CBC WITH DIFFERENTIAL/PLATELET  PROCALCITONIN  LACTIC ACID, PLASMA  POC URINE PREG, ED    EKG   RADIOLOGY   Official radiology report(s): CT Maxillofacial W Contrast  Result Date: 09/01/2021 CLINICAL DATA:  Left facial inflammation EXAM: CT MAXILLOFACIAL WITH CONTRAST TECHNIQUE: Multidetector CT imaging of the maxillofacial structures was performed with  intravenous contrast. Multiplanar CT image reconstructions were also generated. RADIATION DOSE REDUCTION: This exam was performed according to the departmental dose-optimization program which includes automated exposure control, adjustment of the mA and/or kV according to patient size and/or use of iterative reconstruction technique. CONTRAST:  12mL OMNIPAQUE IOHEXOL 300 MG/ML  SOLN COMPARISON:  None Available. FINDINGS: Osseous: No fracture or mandibular dislocation. No destructive process. Orbits: Negative. No traumatic or inflammatory finding. Sinuses: Clear. Soft tissues: Lower left facial skin thickening and subcutaneous inflammatory change. No abscess or drainable fluid collection. Limited intracranial: Normal IMPRESSION: Lower left facial skin thickening and subcutaneous inflammatory change, most likely cellulitis. No abscess or drainable fluid collection. Electronically Signed   By: Deatra Robinson M.D.   On: 09/01/2021  02:00    PROCEDURES and INTERVENTIONS:  Procedures  Medications  iohexol (OMNIPAQUE) 300 MG/ML solution 75 mL (75 mLs Intravenous Contrast Given 09/01/21 0142)  sulfamethoxazole-trimethoprim (BACTRIM DS) 800-160 MG per tablet 1 tablet (1 tablet Oral Given 09/01/21 0550)  lactated ringers bolus 1,000 mL (1,000 mLs Intravenous New Bag/Given 09/01/21 0550)  ketorolac (TORADOL) 30 MG/ML injection 15 mg (15 mg Intravenous Given 09/01/21 0551)     IMPRESSION / MDM / ASSESSMENT AND PLAN / ED COURSE  I reviewed the triage vital signs and the nursing notes.  Differential diagnosis includes, but is not limited to, cellulitis, abscess, sepsis, Ludwig's angina  {Patient presents with symptoms of an acute illness or injury that is potentially life-threatening.  46 year old woman presents to the ED with signs of cellulitis to her face.  No signs of sepsis.  Blood work with no leukocytosis or significant metabolic derangements.  Mild lactic acidosis is noted for which she will receive IV fluids.  We will get her started on antibiotics.  CT is reassuring without a drainable abscess or signs of deep space infection such as a postseptal cellulitis around her eye.  I suspect she will be suitable for outpatient management with close return precautions.      FINAL CLINICAL IMPRESSION(S) / ED DIAGNOSES   Final diagnoses:  Cellulitis of face     Rx / DC Orders   ED Discharge Orders          Ordered    sulfamethoxazole-trimethoprim (BACTRIM DS) 800-160 MG tablet  2 times daily        09/01/21 0449             Note:  This document was prepared using Dragon voice recognition software and may include unintentional dictation errors.   Delton Prairie, MD 09/01/21 Lavell Luster    Delton Prairie, MD 09/01/21 [phone]

## 2021-09-06 LAB — CULTURE, BLOOD (ROUTINE X 2)
Culture: NO GROWTH
Special Requests: ADEQUATE

## 2021-11-08 ENCOUNTER — Other Ambulatory Visit (HOSPITAL_BASED_OUTPATIENT_CLINIC_OR_DEPARTMENT_OTHER): Payer: Self-pay

## 2022-05-25 ENCOUNTER — Inpatient Hospital Stay: Admission: RE | Admit: 2022-05-25 | Payer: Self-pay | Source: Ambulatory Visit

## 2023-01-26 ENCOUNTER — Other Ambulatory Visit: Payer: Self-pay

## 2023-05-07 IMAGING — US US PELVIS COMPLETE TRANSABD/TRANSVAG W DUPLEX
1 series · 13 of 25 positions shown · non-contrast
Comparison: CT with IV contrast dated 06/13/2019

CLINICAL DATA: Lower abdominal pain for 3 days, concerning for PID.

EXAM:
TRANSABDOMINAL AND TRANSVAGINAL ULTRASOUND OF PELVIS
DOPPLER ULTRASOUND OF OVARIES
TECHNIQUE: Both transabdominal and transvaginal ultrasound examinations of the
pelvis were performed. Transabdominal technique was performed for
global imaging of the pelvis including uterus, ovaries, adnexal
regions, and pelvic cul-de-sac.
It was necessary to proceed with endovaginal exam following the
transabdominal exam to visualize the ovaries. Color and duplex
Doppler ultrasound was utilized to evaluate blood flow to the
ovaries.

[Series 1: us pelvic complete w transvaginal and torsion righ · 124 acquisitions, 13 frames shown]
[im 1/124]
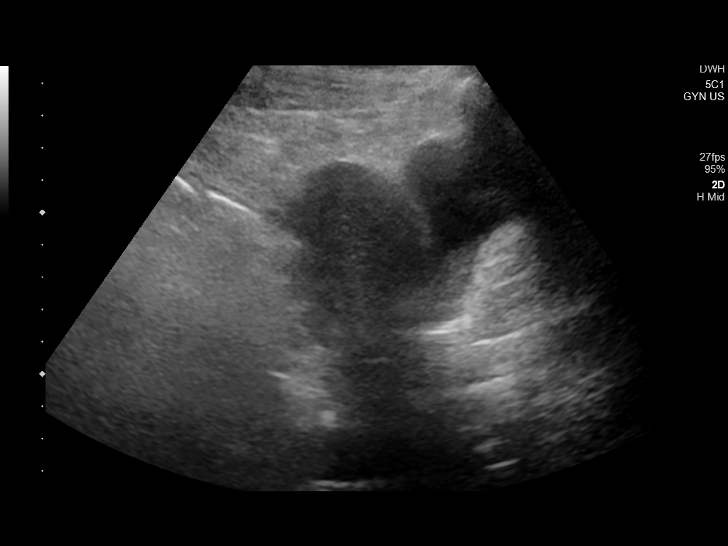
[im 11/124]
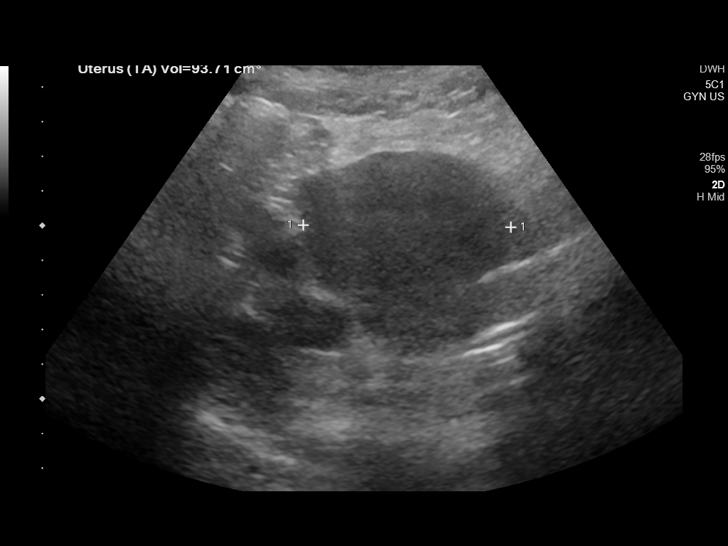
[im 21/124]
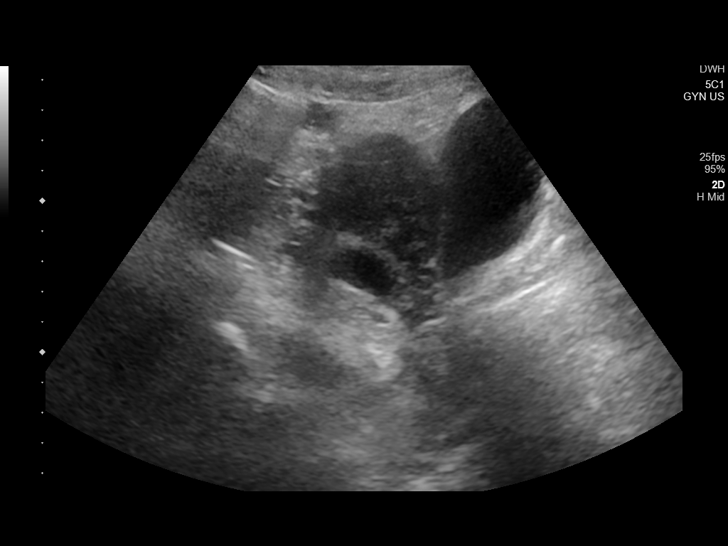
[im 31/124]
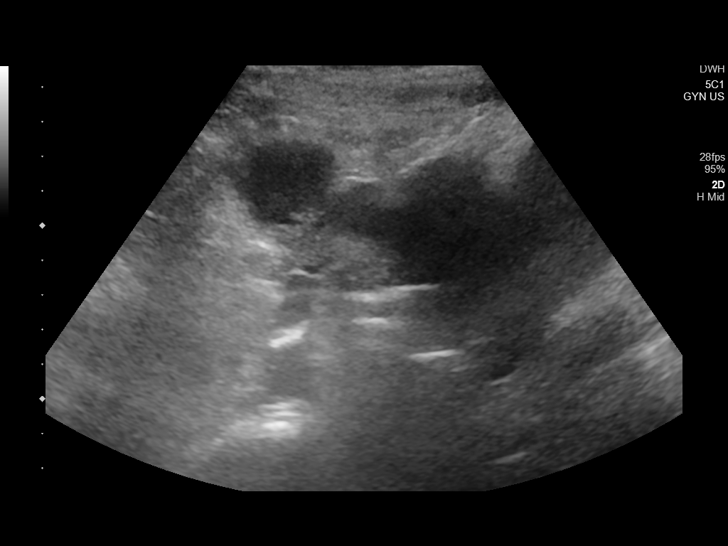
[im 42/124]
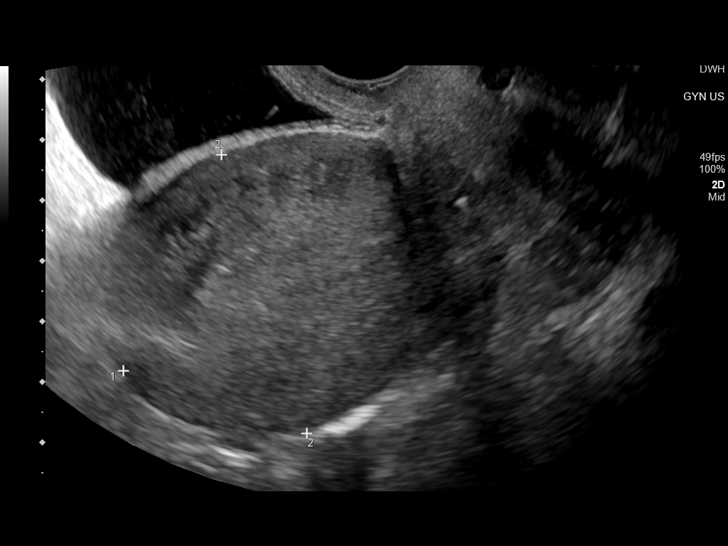
[im 52/124]
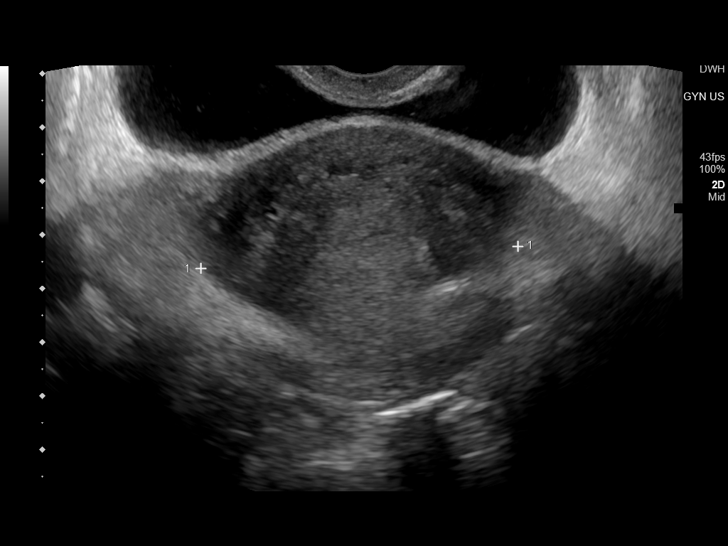
[im 62/124]
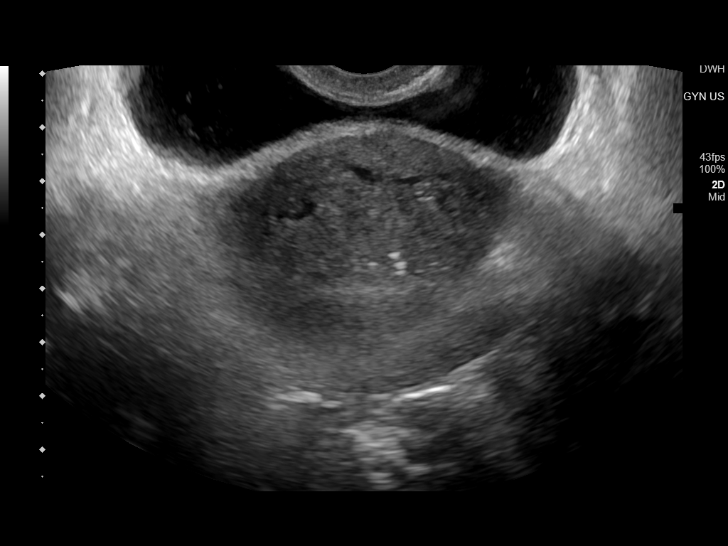
[im 72/124]
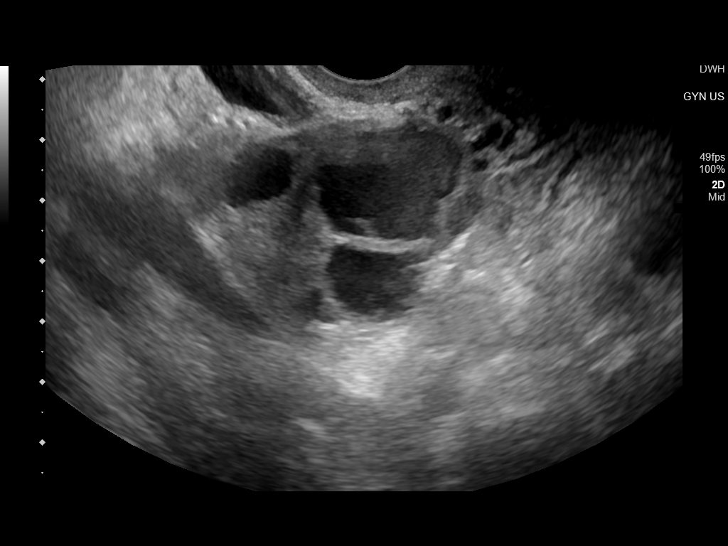
[im 83/124]
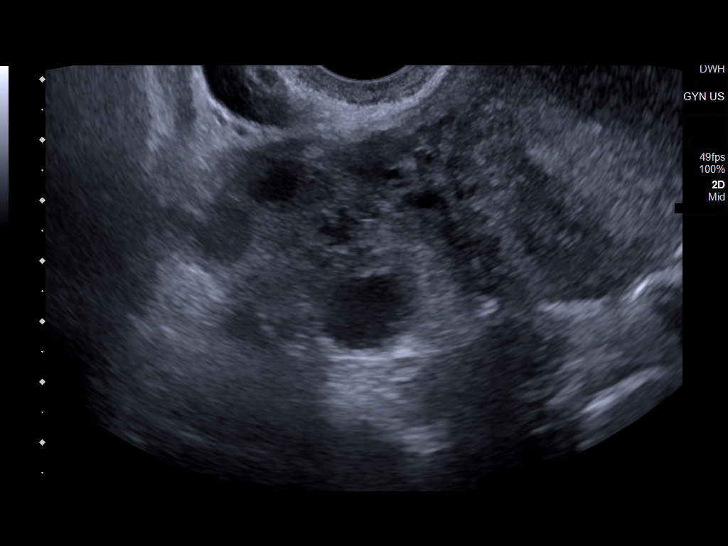
[im 93/124]
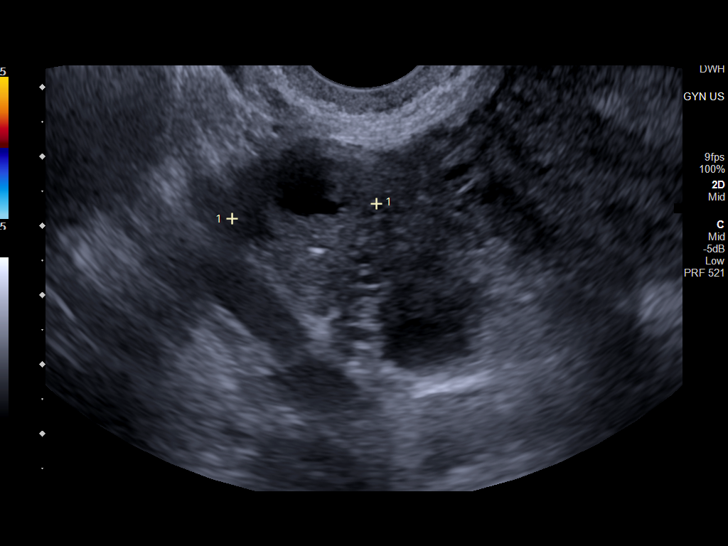
[im 103/124]
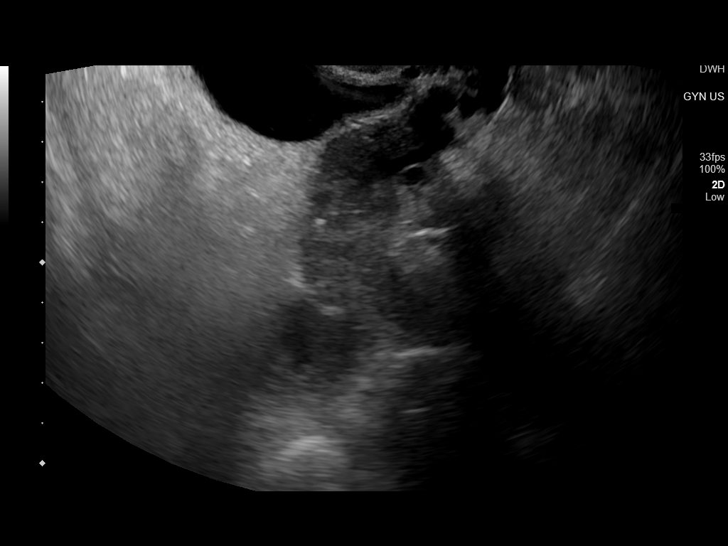
[im 113/124]
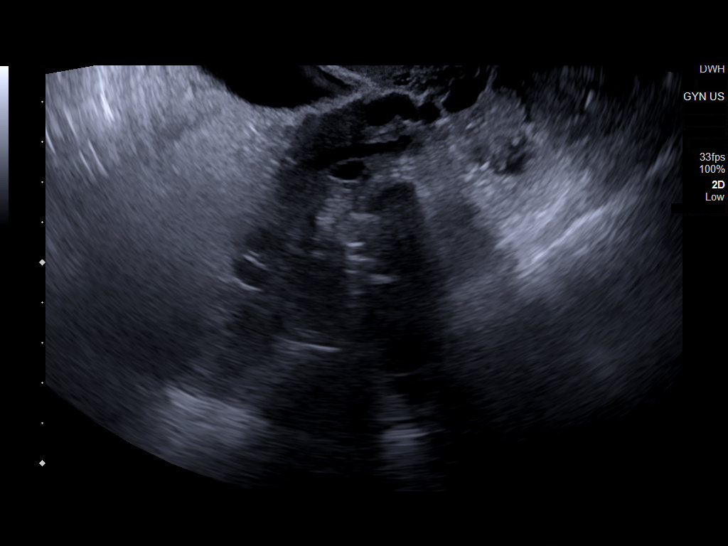
[im 124/124]
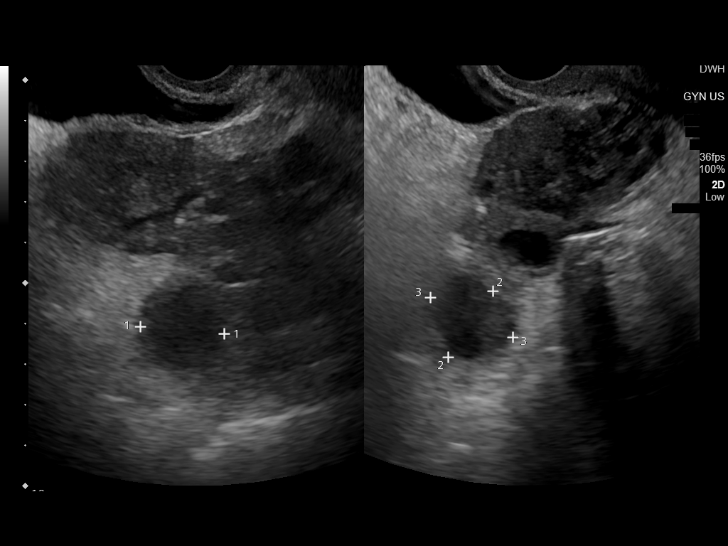

[13 of 25 positions shown; findings below may reference images not displayed]

FINDINGS: Uterus

Measurements: 8.7 x 4.8 x 5.9 = volume: 130 mL. No fibroids or other
mass visualized.

Endometrium

Thickness: 8.7. No focal abnormality visualized apart from scattered
punctate calcifications in the peripheral endometrium.

Right ovary

Measurements: 2.8 x 1.7 x 2.1 = volume: 5.3 mL. Normal appearance/no
adnexal mass, but there is a tortuous, fluid and debris filled
fallopian tube up to 1.4 cm in diameter noted.

Left ovary

Measurements: 4.7 x 2.6 x 4.5 = volume: 29.1 mL. Normal
appearance/no adnexal mass, but question is raised of fluid filling
and distention of the fallopian tube on this side as well although
not as well seen due to positioning of structures. There are also be
a 2.3 cm paraovarian cyst which is also not well seen.

Pulsed Doppler evaluation of both ovaries demonstrates normal
low-resistance arterial and venous waveforms.

Other findings

No abnormal free fluid.  Unremarkable visualized bladder.
IMPRESSION: 1. Right fallopian tube distended with fluid with debris in the
fluid. Findings could be due to a hydrosalpinx or pyosalpinx.
2. Questionable left hydrosalpinx but not as well seen.
3. Questionable 2.3 cm left paraovarian cyst.
4. No endometrial thickening, with scattered calcifications at the
endometrial periphery.
5. Consider CT follow-up for further evaluation, preferably with
contrast.
